# Patient Record
Sex: Male | Born: 1955 | Race: Black or African American | Hispanic: No | Marital: Married | Smoking: Current every day smoker
Health system: Southern US, Community
[De-identification: ages and names within clinical notes are randomized; demographics above are authoritative.]

## PROBLEM LIST (undated history)

## (undated) DIAGNOSIS — R972 Elevated prostate specific antigen [PSA]: Secondary | ICD-10-CM

## (undated) DIAGNOSIS — I10 Essential (primary) hypertension: Secondary | ICD-10-CM

## (undated) DIAGNOSIS — C61 Malignant neoplasm of prostate: Secondary | ICD-10-CM

## (undated) DIAGNOSIS — D759 Disease of blood and blood-forming organs, unspecified: Secondary | ICD-10-CM

## (undated) DIAGNOSIS — E785 Hyperlipidemia, unspecified: Secondary | ICD-10-CM

## (undated) DIAGNOSIS — G4730 Sleep apnea, unspecified: Secondary | ICD-10-CM

## (undated) DIAGNOSIS — E039 Hypothyroidism, unspecified: Secondary | ICD-10-CM

## (undated) DIAGNOSIS — F419 Anxiety disorder, unspecified: Secondary | ICD-10-CM

## (undated) HISTORY — PX: OTHER SURGICAL HISTORY: SHX169

## (undated) HISTORY — DX: Hyperlipidemia, unspecified: E78.5

## (undated) HISTORY — DX: Hypothyroidism, unspecified: E03.9

## (undated) HISTORY — DX: Disease of blood and blood-forming organs, unspecified: D75.9

## (undated) HISTORY — DX: Anxiety disorder, unspecified: F41.9

## (undated) HISTORY — DX: Elevated prostate specific antigen (PSA): R97.20

## (undated) HISTORY — DX: Sleep apnea, unspecified: G47.30

---

## 2007-11-15 DIAGNOSIS — G4733 Obstructive sleep apnea (adult) (pediatric): Secondary | ICD-10-CM | POA: Insufficient documentation

## 2007-11-15 DIAGNOSIS — G4730 Sleep apnea, unspecified: Secondary | ICD-10-CM

## 2007-11-15 HISTORY — DX: Sleep apnea, unspecified: G47.30

## 2009-08-26 ENCOUNTER — Ambulatory Visit: Payer: Self-pay | Admitting: Family Medicine

## 2010-02-11 ENCOUNTER — Ambulatory Visit: Payer: Self-pay | Admitting: Internal Medicine

## 2010-02-12 ENCOUNTER — Ambulatory Visit: Payer: Self-pay | Admitting: Internal Medicine

## 2010-03-14 ENCOUNTER — Ambulatory Visit: Payer: Self-pay | Admitting: Internal Medicine

## 2015-07-17 ENCOUNTER — Ambulatory Visit: Admission: EM | Admit: 2015-07-17 | Discharge: 2015-07-17 | Discharge status: Absent without leave | Payer: Self-pay

## 2015-07-17 NOTE — ED Notes (Signed)
Pt decided to see PCP and have Sleep Study repeated before going forward with DOT Physical, as he has hx of Sleep Apnea but has not slept with CPAP in years. This is an initial DOT and he said he did not know the DOT rules. Very pleasant.

## 2015-11-11 DIAGNOSIS — E039 Hypothyroidism, unspecified: Secondary | ICD-10-CM | POA: Insufficient documentation

## 2015-11-11 DIAGNOSIS — R972 Elevated prostate specific antigen [PSA]: Secondary | ICD-10-CM

## 2015-11-11 DIAGNOSIS — E038 Other specified hypothyroidism: Secondary | ICD-10-CM

## 2015-11-11 HISTORY — DX: Other specified hypothyroidism: E03.8

## 2015-11-11 HISTORY — DX: Elevated prostate specific antigen (PSA): R97.20

## 2016-01-21 ENCOUNTER — Other Ambulatory Visit: Payer: Self-pay

## 2016-02-03 ENCOUNTER — Encounter: Payer: Self-pay | Admitting: Urology

## 2016-02-03 ENCOUNTER — Ambulatory Visit (INDEPENDENT_AMBULATORY_CARE_PROVIDER_SITE_OTHER): Payer: 59 | Admitting: Urology

## 2016-02-03 VITALS — BP 162/91 | HR 102 | Ht 70.0 in | Wt 190.0 lb

## 2016-02-03 DIAGNOSIS — R972 Elevated prostate specific antigen [PSA]: Secondary | ICD-10-CM

## 2016-02-03 DIAGNOSIS — R3129 Other microscopic hematuria: Secondary | ICD-10-CM

## 2016-02-03 DIAGNOSIS — F419 Anxiety disorder, unspecified: Secondary | ICD-10-CM

## 2016-02-03 DIAGNOSIS — D759 Disease of blood and blood-forming organs, unspecified: Secondary | ICD-10-CM

## 2016-02-03 DIAGNOSIS — E785 Hyperlipidemia, unspecified: Secondary | ICD-10-CM

## 2016-02-03 HISTORY — DX: Anxiety disorder, unspecified: F41.9

## 2016-02-03 HISTORY — DX: Hyperlipidemia, unspecified: E78.5

## 2016-02-03 HISTORY — DX: Disease of blood and blood-forming organs, unspecified: D75.9

## 2016-02-03 LAB — URINALYSIS, COMPLETE
Bilirubin, UA: NEGATIVE
Glucose, UA: NEGATIVE
Ketones, UA: NEGATIVE
LEUKOCYTES UA: NEGATIVE
Nitrite, UA: NEGATIVE
PH UA: 5.5 (ref 5.0–7.5)
PROTEIN UA: NEGATIVE
Specific Gravity, UA: 1.025 (ref 1.005–1.030)
Urobilinogen, Ur: 2 mg/dL — ABNORMAL HIGH (ref 0.2–1.0)

## 2016-02-03 LAB — MICROSCOPIC EXAMINATION
BACTERIA UA: NONE SEEN
WBC UA: NONE SEEN /HPF (ref 0–?)

## 2016-02-03 NOTE — Progress Notes (Signed)
02/03/2016 1:26 PM   Chad Cross 1956-07-15 [id number]  Referring provider: Victoriano Lain Gauger  Chief Complaint  Patient presents with  . Hematuria    New Patient  . Elevated PSA    HPI: 60 year old African-American male referred by his PCP, Gaetano Net, for further evaluation of elevated PSA/possible microscopic hematuria.    Microscopic hematuria Patient underwent routine UA at the time of drug screen for work. This was on 01/14/2016.  Dip at the time showed 2+ blood, no microscopic exam performed to confirm presence of microscopic hematuria. Patient denies a history of microscopic hematuria which she is aware. No history of gross hematuria. No voiding symptoms. Denies, frequency, urgency, kidney stones, or urinary tract infections.  UA today again shows 2+ blood but no evidence of microscopic hematuria.  Elevated PSA   Checked by PCP x 1 in December in 2016 4.95.  No previous PSA for reviewed.  No recent rectal exam. He does have a bother with prostate cancer dx at age 40, underwent prostatectecomy.  No weight loss or bone pain.   PMH: Past Medical History  Diagnosis Date  . Subclinical hypothyroidism 11/11/2015  . Anxiety 02/03/2016  . Apnea, sleep 11/15/2007    Overview:  Has CPAP but is unable to tolerate the mask due to anxiety.   . Cytopenia 02/03/2016  . Elevated prostate specific antigen (PSA) 11/11/2015  . HLD (hyperlipidemia) 02/03/2016    Surgical History: Past Surgical History  Procedure Laterality Date  . None      Home Medications:    Medication List       This list is accurate as of: 02/03/16  1:26 PM.  Always use your most recent med list.               levothyroxine 25 MCG tablet  Commonly known as:  SYNTHROID, LEVOTHROID  Take 25 mcg by mouth daily.     simvastatin 10 MG tablet  Commonly known as:  ZOCOR  TAKE 1 TABLET (10 MG TOTAL) BY MOUTH NIGHTLY.        Allergies: No Known Allergies  Family History: Family History    Problem Relation Age of Onset  . Prostate cancer Brother   . Bladder Cancer Neg Hx   . Kidney cancer Neg Hx     Social History:  reports that he has been smoking Cigarettes.  He has been smoking about 1.00 pack per day. He does not have any smokeless tobacco history on file. He reports that he drinks alcohol. He reports that he does not use illicit drugs.  ROS: UROLOGY Frequent Urination?: No Hard to postpone urination?: No Burning/pain with urination?: No Get up at night to urinate?: No Leakage of urine?: No Urine stream starts and stops?: No Trouble starting stream?: No Do you have to strain to urinate?: No Blood in urine?: Yes Urinary tract infection?: No Sexually transmitted disease?: No Injury to kidneys or bladder?: No Painful intercourse?: No Weak stream?: No Erection problems?: No Penile pain?: No  Gastrointestinal Nausea?: No Vomiting?: No Indigestion/heartburn?: No Diarrhea?: No Constipation?: No  Constitutional Fever: No Night sweats?: No Weight loss?: No Fatigue?: No  Skin Skin rash/lesions?: No Itching?: No  Eyes Blurred vision?: No Double vision?: No  Ears/Nose/Throat Sore throat?: No Sinus problems?: No  Hematologic/Lymphatic Swollen glands?: No Easy bruising?: No  Cardiovascular Leg swelling?: No Chest pain?: No  Respiratory Cough?: No Shortness of breath?: No  Endocrine Excessive thirst?: No  Musculoskeletal Back pain?: No Joint pain?: No  Neurological  Headaches?: No Dizziness?: No  Psychologic Depression?: No Anxiety?: No  Physical Exam: BP 162/91 mmHg  Pulse 102  Ht 5\' 10"  (1.778 m)  Wt 190 lb (86.183 kg)  BMI 27.26 kg/m2  Constitutional:  Alert and oriented, No acute distress. HEENT: [place] AT, moist mucus membranes.  Trachea midline, no masses. Cardiovascular: No clubbing, cyanosis, or edema. Respiratory: Normal respiratory effort, no increased work of breathing. GI: Abdomen is soft, nontender, nondistended,  no abdominal masses GU: No CVA tenderness.  Rectal: Normal strength or tone. 40 cc prostate, smooth, nontender, no nodules. Skin: No rashes, bruises or suspicious lesions. Lymph: No cervical or inguinal adenopathy. Neurologic: Grossly intact, no focal deficits, moving all 4 extremities. Psychiatric: Normal mood and affect.  Laboratory Data:   Urinalysis Results for orders placed or performed in visit on 02/03/16  Microscopic Examination  Result Value Ref Range   WBC, UA None seen 0 -  5 /hpf   RBC, UA 0-2 0 -  2 /hpf   Epithelial Cells (non renal) 0-10 0 - 10 /hpf   Mucus, UA Present (A) Not Estab.   Bacteria, UA None seen None seen/Few  Urinalysis, Complete  Result Value Ref Range   Specific Gravity, UA 1.025 1.005 - 1.030   pH, UA 5.5 5.0 - 7.5   Color, UA Yellow Yellow   Appearance Ur Clear Clear   Leukocytes, UA Negative Negative   Protein, UA Negative Negative/Trace   Glucose, UA Negative Negative   Ketones, UA Negative Negative   RBC, UA 2+ (A) Negative   Bilirubin, UA Negative Negative   Urobilinogen, Ur 2.0 (H) 0.2 - 1.0 mg/dL   Nitrite, UA Negative Negative   Microscopic Examination See below:     Pertinent Imaging: NA  Assessment & Plan:    1. Elevated PSA  We reviewed the implications of an elevated PSA and the uncertainty surrounding it. In general, a man's PSA increases with age and is produced by both normal and cancerous prostate tissue. Differential for elevated PSA is BPH, prostate cancer, infection, recent intercourse/ejaculation, prostate infarction, recent urethroscopic manipulation (foley placement/cystoscopy) and prostatitis. Management of an elevated PSA can include observation or prostate biopsy and wediscussed this in detail. We discussed that indications for prostate biopsy are defined by age and race specific PSA cutoffs as well as a PSA velocity of 0.75/year.  Repeat PSA drawn today, will call with recommendations.  Rectal exam benign.  We  did go ahead and discuss prostate biopsy in detail including the procedure itself, the risks of blood in the urine, stool, and ejaculate, serious infection, and discomfort.    - PSA  2. Microscopic hematuria History of 2+ blood on urine dip but no evidence of microscopic hematuria today. As such, he does not currently meet guidelines for microscopic hematuria workup which includes greater than 3 red blood cells per high-powered field on 2 individual urinalyses in the absence of infection. Recommend repeat UA with microscopic exam either here in our office or by PCP and will reassess need for further workup if positive x2.    - Urinalysis, Complete   Return for will call with PSA results, recommendations.  Hollice Espy, MD  [facility] [address], [place], [place] [address] [phone]

## 2016-02-04 LAB — PSA: PROSTATE SPECIFIC AG, SERUM: 4.2 ng/mL — AB (ref 0.0–4.0)

## 2016-02-05 ENCOUNTER — Telehealth: Payer: Self-pay

## 2016-02-05 NOTE — Telephone Encounter (Signed)
Made PCP aware.  Recommend f/u PSA in 6 months a minimum.    Hollice Espy, MD

## 2016-02-05 NOTE — Telephone Encounter (Signed)
Spoke with patient and notified him of results. Patient states he is not interested in proceeding with a prostate biopsy at this time. It was explained to the patient that this was to rule out prostate cancer and it is important for him to understand the risk involved in declining this test to rule out prostate cancer. Patient verbalized understanding and again stated that he did not want to go ahead with the procedure at this time. He was asked if he would be ok with scheduling a follow up apt to come and discuss this with a physician or at least to schedule a follow up with a PSA to monitor. He states that he would call back to schedule an apt to monitor at another time.

## 2016-02-05 NOTE — Telephone Encounter (Signed)
-----   Message from Hollice Espy, MD sent at 02/04/2016  2:45 PM EDT ----- PSA still elevated at 4.2.  I recommend prostate biopsy given risk factors.  Please arrange and review pre procedure instructions (discussed in the office already).   Hollice Espy, MD

## 2016-02-05 NOTE — Telephone Encounter (Signed)
Left pt mess to call/SW 

## 2019-06-12 ENCOUNTER — Other Ambulatory Visit: Payer: Self-pay

## 2019-06-12 DIAGNOSIS — R972 Elevated prostate specific antigen [PSA]: Secondary | ICD-10-CM

## 2019-06-13 ENCOUNTER — Other Ambulatory Visit: Payer: Self-pay

## 2019-06-13 ENCOUNTER — Other Ambulatory Visit: Payer: Medicare Other

## 2019-06-13 DIAGNOSIS — R972 Elevated prostate specific antigen [PSA]: Secondary | ICD-10-CM

## 2019-06-14 LAB — PSA: Prostate Specific Ag, Serum: 8.8 ng/mL — ABNORMAL HIGH (ref 0.0–4.0)

## 2019-06-18 ENCOUNTER — Ambulatory Visit: Payer: Medicare Other | Admitting: Urology

## 2019-06-18 ENCOUNTER — Encounter: Payer: Self-pay | Admitting: Urology

## 2019-06-18 ENCOUNTER — Other Ambulatory Visit: Payer: Self-pay

## 2019-06-18 VITALS — BP 170/97 | HR 116 | Ht 70.0 in | Wt 207.0 lb

## 2019-06-18 DIAGNOSIS — R972 Elevated prostate specific antigen [PSA]: Secondary | ICD-10-CM

## 2019-06-18 DIAGNOSIS — R3129 Other microscopic hematuria: Secondary | ICD-10-CM

## 2019-06-18 NOTE — Patient Instructions (Signed)

## 2019-06-18 NOTE — Progress Notes (Signed)
06/18/2019 8:41 AM   Chad Cross [birth year] [id number]  Referring provider: [name], NP [address] [place],  [place] [address]  Chief Complaint  Patient presents with  . Elevated PSA    HPI: 63 year old male who presents today for further evaluation of rising PSA and microscopic hematuria.  He was seen and evaluated in 01/2016 for possible microscopic hematuria.  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  Notably, the time he also had an elevated PSA to 4.95 in December 2016.  His brother was diagnosed with prostate cancer at age 54 and underwent prostatectomy.  Given his risk factors and absolute PSA, he was advised to undergo prostate biopsy but declined.  He was advised to follow-up in 6 months but failed to do so.  In the interim, his PSA continues to climb.  His PSA on 05/2019 rose to 8.8.  Repeat today is pending.  He denies any significant urinary symptoms.  He gets up once at night to void.  He feels like he empties his bladder sufficiently.  He denies any gross hematuria or dysuria.  He is a smoker.  No weight loss or bone pain.  PMH: Past Medical History:  Diagnosis Date  . Anxiety 02/03/2016  . Apnea, sleep 11/15/2007   Overview:  Has CPAP but is unable to tolerate the mask due to anxiety.   . Cytopenia 02/03/2016  . Elevated prostate specific antigen (PSA) 11/11/2015  . HLD (hyperlipidemia) 02/03/2016  . Subclinical hypothyroidism 11/11/2015    Surgical History: Past Surgical History:  Procedure Laterality Date  . none      Home Medications:  Allergies as of 06/18/2019      Reactions   Lisinopril Cough      Medication List       Accurate as of June 18, 2019 11:59 PM. If you have any questions, ask your nurse or doctor.        levothyroxine 25 MCG tablet Commonly known as: SYNTHROID Take 25 mcg by mouth daily.   simvastatin 10 MG tablet  Commonly known as: ZOCOR TAKE 1 TABLET (10 MG TOTAL) BY MOUTH NIGHTLY.       Allergies:  Allergies  Allergen Reactions  . Lisinopril Cough    Family History: Family History  Problem Relation Age of Onset  . Prostate cancer Brother   . Bladder Cancer Neg Hx   . Kidney cancer Neg Hx     Social History:  reports that he has been smoking cigarettes. He has been smoking about 1.00 pack per day. He has never used smokeless tobacco. He reports current alcohol use. He reports that he does not use drugs.  ROS: UROLOGY Frequent Urination?: No Hard to postpone urination?: No Burning/pain with urination?: No Get up at night to urinate?: No Leakage of urine?: No Urine stream starts and stops?: No Trouble starting stream?: No Do you have to strain to urinate?: No Blood in urine?: No Urinary tract infection?: No Sexually transmitted disease?: No Injury to kidneys or bladder?: No Painful intercourse?: No Weak stream?: No Erection problems?: No Penile pain?: No  Gastrointestinal Nausea?: No Vomiting?: No Indigestion/heartburn?: No Diarrhea?: No Constipation?: No  Constitutional Fever: No Night sweats?: No Weight loss?: No Fatigue?: No  Skin Skin rash/lesions?: No Itching?: No  Eyes Blurred vision?: No Double vision?: No  Ears/Nose/Throat Sore throat?: No Sinus problems?: No  Hematologic/Lymphatic Swollen glands?: No Easy bruising?: No  Cardiovascular Leg swelling?: No Chest pain?: No  Respiratory Cough?: No Shortness of breath?: No  Endocrine Excessive thirst?: No  Musculoskeletal Back pain?: No Joint pain?: No  Neurological Headaches?: No Dizziness?: No  Psychologic Depression?: No Anxiety?: No  Physical Exam: BP (!) 170/97   Pulse (!) 116   Ht 5\' 10"  (1.778 m)   Wt 207 lb (93.9 kg)   BMI 29.70 kg/m   Constitutional:  Alert and oriented, No acute distress. HEENT: [place] AT, moist mucus membranes.  Trachea midline, no masses.  Cardiovascular: No clubbing, cyanosis, or edema. Respiratory: Normal respiratory effort, no increased work of breathing. GI: Abdomen is soft, nontender, nondistended, no abdominal masses Rectal: Normal sphincter tone.  Mildly enlarged prostate with possible nodule at apex, unable to palpate base of gland due to habitus. Skin: No rashes, bruises or suspicious lesions. Neurologic: Grossly intact, no focal deficits, moving all 4 extremities. Psychiatric: Normal mood and affect.  Laboratory Data: PSA trend as above  Urinalysis Results for orders placed or performed in visit on 06/18/19  Microscopic Examination   URINE  Result Value Ref Range   WBC, UA 0-5 0 - 5 /hpf   RBC 3-10 (A) 0 - 2 /hpf   Epithelial Cells (non renal) 0-10 0 - 10 /hpf   Bacteria, UA None seen None seen/Few  PSA  Result Value Ref Range   Prostate Specific Ag, Serum 10.5 (H) 0.0 - 4.0 ng/mL  Urinalysis, Complete  Result Value Ref Range   Specific Gravity, UA 1.015 1.005 - 1.030   pH, UA 6.5 5.0 - 7.5   Color, UA Straw Yellow   Appearance Ur Cloudy (A) Clear   Leukocytes,UA Negative Negative   Protein,UA 2+ (A) Negative/Trace   Glucose, UA Negative Negative   Ketones, UA Negative Negative   RBC, UA 1+ (A) Negative   Bilirubin, UA Negative Negative   Urobilinogen, Ur 0.2 0.2 - 1.0 mg/dL   Nitrite, UA Negative Negative   Microscopic Examination See below:     Assessment & Plan:    1. Elevated prostate specific antigen (PSA) Multiple risk factors including elevated and rising PSA as well as family history of prostate cancer  As per previously, I have strongly recommended that he pursue prostate biopsy.  We discussed this in the past and that this at this point, he is finally interested in pursuing this in light of the significant rise over time.  Repeat PSA today is pending.  We discussed prostate biopsy in detail including the procedure itself, the risks of blood in the urine, stool, and ejaculate, serious  infection, and discomfort. He is willing to proceed with this as discussed. - PSA  2. Elevated PSA As above - Urinalysis  3. Microscopic hematuria We discussed the differential diagnosis for microscopic hematuria including nephrolithiasis, renal or upper tract tumors, bladder stones, UTIs, or bladder tumors as well as undetermined etiologies. Per AUA guidelines, I did recommend complete microscopic hematuria evaluation including CTU, possible urine cytology, and office cystoscopy.   - CT HEMATURIA WORKUP; Future  Return for schedule prostate biopsy. / cystoscopy/ f/u CT urogram  Hollice Espy, MD  [facility] [address], [place], [place] [address] [phone]

## 2019-06-19 LAB — URINALYSIS, COMPLETE
Bilirubin, UA: NEGATIVE
Glucose, UA: NEGATIVE
Ketones, UA: NEGATIVE
Leukocytes,UA: NEGATIVE
Nitrite, UA: NEGATIVE
Specific Gravity, UA: 1.015 (ref 1.005–1.030)
Urobilinogen, Ur: 0.2 mg/dL (ref 0.2–1.0)
pH, UA: 6.5 (ref 5.0–7.5)

## 2019-06-19 LAB — MICROSCOPIC EXAMINATION: Bacteria, UA: NONE SEEN

## 2019-06-19 LAB — PSA: Prostate Specific Ag, Serum: 10.5 ng/mL — ABNORMAL HIGH (ref 0.0–4.0)

## 2019-06-26 ENCOUNTER — Other Ambulatory Visit: Payer: Self-pay

## 2019-06-26 ENCOUNTER — Other Ambulatory Visit: Payer: Self-pay | Admitting: Urology

## 2019-06-26 DIAGNOSIS — R972 Elevated prostate specific antigen [PSA]: Secondary | ICD-10-CM

## 2019-06-27 ENCOUNTER — Ambulatory Visit
Admission: RE | Admit: 2019-06-27 | Discharge: 2019-06-27 | Disposition: A | Discharge status: Home / Self Care | Payer: Medicare Other | Source: Ambulatory Visit | Attending: Urology | Admitting: Urology

## 2019-06-27 ENCOUNTER — Other Ambulatory Visit: Payer: Self-pay

## 2019-06-27 ENCOUNTER — Other Ambulatory Visit
Admission: RE | Admit: 2019-06-27 | Discharge: 2019-06-27 | Disposition: A | Discharge status: Home / Self Care | Payer: Medicare Other | Source: Ambulatory Visit | Attending: Urology | Admitting: Urology

## 2019-06-27 DIAGNOSIS — R972 Elevated prostate specific antigen [PSA]: Secondary | ICD-10-CM

## 2019-06-27 DIAGNOSIS — R3129 Other microscopic hematuria: Secondary | ICD-10-CM | POA: Diagnosis not present

## 2019-06-27 DIAGNOSIS — I700 Atherosclerosis of aorta: Secondary | ICD-10-CM | POA: Insufficient documentation

## 2019-06-27 HISTORY — DX: Essential (primary) hypertension: I10

## 2019-06-27 LAB — CREATININE, SERUM
Creatinine, Ser: 1.15 mg/dL (ref 0.61–1.24)
GFR calc Af Amer: >60 mL/min (ref >60)
GFR calc non Af Amer: >60 mL/min (ref >60)

## 2019-06-27 MED ORDER — IOHEXOL 300 MG/ML  SOLN
150.0000 mL | Freq: Once | INTRAMUSCULAR | Status: AC | PRN
  Administered 2019-06-27: 10:00:00 125 mL via INTRAVENOUS

## 2019-07-23 ENCOUNTER — Other Ambulatory Visit: Payer: Self-pay | Admitting: Urology

## 2019-07-23 ENCOUNTER — Encounter: Payer: Self-pay | Admitting: Urology

## 2019-07-23 ENCOUNTER — Ambulatory Visit (INDEPENDENT_AMBULATORY_CARE_PROVIDER_SITE_OTHER): Payer: Medicare Other | Admitting: Urology

## 2019-07-23 ENCOUNTER — Other Ambulatory Visit: Payer: Self-pay

## 2019-07-23 VITALS — BP 185/108 | HR 118 | Ht 70.0 in | Wt 206.0 lb

## 2019-07-23 DIAGNOSIS — R972 Elevated prostate specific antigen [PSA]: Secondary | ICD-10-CM | POA: Diagnosis not present

## 2019-07-23 DIAGNOSIS — R3129 Other microscopic hematuria: Secondary | ICD-10-CM

## 2019-07-23 DIAGNOSIS — K769 Liver disease, unspecified: Secondary | ICD-10-CM

## 2019-07-23 MED ORDER — LEVOFLOXACIN 500 MG PO TABS
500.0000 mg | ORAL_TABLET | Freq: Once | ORAL | Status: AC
  Administered 2019-07-23: 500 mg via ORAL

## 2019-07-23 MED ORDER — GENTAMICIN SULFATE 40 MG/ML IJ SOLN
80.0000 mg | Freq: Once | INTRAMUSCULAR | Status: AC
  Administered 2019-07-23: 80 mg via INTRAMUSCULAR

## 2019-07-23 NOTE — Progress Notes (Signed)
   07/23/19  CC:  Chief Complaint  Patient presents with  . Prostate Biopsy  . Cysto    HPI:  63 year old male with elevated rising PSA presents today for prostate biopsy.   Blood pressure (!) 185/108, pulse (!) 118, height 5\' 10"  (1.778 m), weight 206 lb (93.4 kg). NED. A&Ox3.   No respiratory distress   Abd soft, NT, ND Normal sphincter tone  Prostate Biopsy Procedure   Informed consent was obtained after discussing risks/benefits of the procedure.  A time out was performed to ensure correct patient identity.  Pre-Procedure: - Gentamicin given prophylactically - Levaquin 500 mg administered PO -Transrectal Ultrasound performed revealing a 46.6 gm prostate -No significant hypoechoic or median lobe noted  Procedure: - Prostate block performed using 10 cc 1% lidocaine and biopsies taken from sextant areas, a total of 12 under ultrasound guidance.  Post-Procedure: - Patient tolerated the procedure well - He was counseled to seek immediate medical attention if experiences any severe pain, significant bleeding, or fevers - Return in two week to discuss biopsy results   Hollice Espy, MD

## 2019-07-23 NOTE — Progress Notes (Signed)
   07/23/19  CC:  Chief Complaint  Patient presents with  . Prostate Biopsy  . Cysto    HPI: 63 year old male with a personal history of elevated PSA as well as microscopic hematuria who presents today both for cystoscopy and prostate biopsy.  Notably, in the interim, he underwent CT urogram which shows no GU pathology.   He definitely does have 2.3 cm liver mass, most likely related to a hemangioma however was not completely characterized.   Blood pressure (!) 185/108, pulse (!) 118, height 5\' 10"  (1.778 m), weight 206 lb (93.4 kg). NED. A&Ox3.   No respiratory distress   Abd soft, NT, ND Normal phallus with bilateral descended testicles  Cystoscopy Procedure Note  Patient identification was confirmed, informed consent was obtained, and patient was prepped using Betadine solution.  Lidocaine jelly was administered per urethral meatus.     Pre-Procedure: - Inspection reveals a normal caliber ureteral meatus.  Procedure: The flexible cystoscope was introduced without difficulty - No urethral strictures/lesions are present. - Enlarged prostate with bilobar coaptation - Normal bladder neck - Bilateral ureteral orifices identified - Bladder mucosa  reveals no ulcers, tumors, or lesions - No bladder stones - No trabeculation  Retroflexion unremarkable   Post-Procedure: - Patient tolerated the procedure well  Assessment/ Plan:  1. Elevated PSA S/p prostate biopsy today - gentamicin (GARAMYCIN) injection 80 mg - levofloxacin (LEVAQUIN) tablet 500 mg  2. Microscopic hematuria CT urogram and cystoscopy negative  3. Liver lesion Incidental on CT scan Likely hemangioma Radiology recommends MRI of liver to rule out underlying malignancy, patient is agreeable this plan - MR LIVER W WO CONTRAST; Future  Hollice Espy, MD

## 2019-07-23 NOTE — Progress Notes (Deleted)
07/23/2019 10:20 AM   Chad Cross [birth year] [id number]  Referring provider: [name], NP [address] [place],  [place] [address]  No chief complaint on file.   HPI:    PMH: Past Medical History:  Diagnosis Date  . Anxiety 02/03/2016  . Apnea, sleep 11/15/2007   Overview:  Has CPAP but is unable to tolerate the mask due to anxiety.   . Cytopenia 02/03/2016  . Elevated prostate specific antigen (PSA) 11/11/2015  . HLD (hyperlipidemia) 02/03/2016  . Hypertension   . Subclinical hypothyroidism 11/11/2015    Surgical History: Past Surgical History:  Procedure Laterality Date  . none      Home Medications:  Allergies as of 07/23/2019      Reactions   Lisinopril Cough      Medication List       Accurate as of July 23, 2019 10:20 AM. If you have any questions, ask your nurse or doctor.        levothyroxine 25 MCG tablet Commonly known as: SYNTHROID Take 25 mcg by mouth daily.   simvastatin 10 MG tablet Commonly known as: ZOCOR TAKE 1 TABLET (10 MG TOTAL) BY MOUTH NIGHTLY.       Allergies:  Allergies  Allergen Reactions  . Lisinopril Cough    Family History: Family History  Problem Relation Age of Onset  . Prostate cancer Brother   . Bladder Cancer Neg Hx   . Kidney cancer Neg Hx     Social History:  reports that he has been smoking cigarettes. He has been smoking about 1.00 pack per day. He has never used smokeless tobacco. He reports current alcohol use. He reports that he does not use drugs.  ROS:                                        Physical Exam: There were no vitals taken for this visit.  Constitutional:  Alert and oriented, No acute distress. HEENT: [place] AT, moist mucus membranes.  Trachea midline, no masses. Cardiovascular: No clubbing, cyanosis, or edema. Respiratory: Normal respiratory effort, no increased work of breathing. GI: Abdomen is soft, nontender, nondistended, no abdominal masses GU:  No CVA tenderness Lymph: No cervical or inguinal lymphadenopathy. Skin: No rashes, bruises or suspicious lesions. Neurologic: Grossly intact, no focal deficits, moving all 4 extremities. Psychiatric: Normal mood and affect.  Laboratory Data: No results found for: WBC, HGB, HCT, MCV, PLT  Lab Results  Component Value Date   CREATININE 1.15 06/27/2019    No results found for: PSA  No results found for: TESTOSTERONE  No results found for: HGBA1C  Urinalysis    Component Value Date/Time   APPEARANCEUR Cloudy (A) 06/18/2019 1007   GLUCOSEU Negative 06/18/2019 1007   BILIRUBINUR Negative 06/18/2019 1007   PROTEINUR 2+ (A) 06/18/2019 1007   NITRITE Negative 06/18/2019 1007   LEUKOCYTESUR Negative 06/18/2019 1007    Lab Results  Component Value Date   LABMICR See below: 06/18/2019   WBCUA 0-5 06/18/2019   RBCUA 0-2 02/03/2016   LABEPIT 0-10 06/18/2019   MUCUS Present (A) 02/03/2016   BACTERIA None seen 06/18/2019    Pertinent Imaging: *** No results found for this or any previous visit. No results found for this or any previous visit. No results found for this or any previous visit. No results found for this or any previous visit. No results found for  this or any previous visit. No results found for this or any previous visit. Results for orders placed during the hospital encounter of 06/27/19  CT HEMATURIA WORKUP   Narrative CLINICAL DATA:  Microscopic hematuria.  EXAM: CT ABDOMEN AND PELVIS WITHOUT AND WITH CONTRAST  TECHNIQUE: Multidetector CT imaging of the abdomen and pelvis was performed following the standard protocol before and following the bolus administration of intravenous contrast.  CONTRAST:  155mL OMNIPAQUE IOHEXOL 300 MG/ML  SOLN  COMPARISON:  None.  FINDINGS: Lower Chest: No acute findings.  Hepatobiliary: Several liver lesions are seen, with largest in segment 4A measuring 2.3 cm on image 16/4. The larger lesions show evidence of  peripheral enhancement, suggesting the could represent hemangiomas, however CT characteristics remain indeterminate. Gallbladder is unremarkable. No evidence of biliary ductal dilatation.  Pancreas:  No mass or inflammatory changes.  Spleen: Within normal limits in size and appearance.  Adrenals/Urinary Tract: No adrenal masses identified. No evidence of urolithiasis or hydronephrosis. Mild scarring seen involving the lower pole of left kidney. No renal masses identified. No masses seen involving the ureters or bladder. Diffuse bladder wall thickening is seen, most likely due to chronic bladder outlet obstruction given enlarged prostate.  Stomach/Bowel: No evidence of obstruction, inflammatory process or abnormal fluid collections.  Vascular/Lymphatic: No pathologically enlarged lymph nodes. No abdominal aortic aneurysm. Aortic atherosclerosis.  Reproductive:  Mildly enlarged prostate.  Other:  None.  Musculoskeletal:  No suspicious bone lesions identified.  IMPRESSION: 1. No radiographic evidence of urinary tract neoplasm, urolithiasis, or hydronephrosis. 2. Mildly enlarged prostate, and findings of chronic bladder outlet obstruction. 3. Several indeterminate liver lesions, largest measuring 2.3 cm. Abdomen MRI without and with contrast is recommended for further characterization.   Electronically Signed   By: Marlaine Hind M.D.   On: 06/27/2019 11:45    No results found for this or any previous visit.  Assessment & Plan:    There are no diagnoses linked to this encounter.  No follow-ups on file.  Hollice Espy, MD  [facility] [address] [place], [place] [address] [phone]

## 2019-07-26 LAB — ANATOMIC PATHOLOGY REPORT: PDF Image: 0

## 2019-08-05 ENCOUNTER — Other Ambulatory Visit: Payer: Self-pay | Admitting: Urology

## 2019-08-06 ENCOUNTER — Encounter: Payer: Self-pay | Admitting: Urology

## 2019-08-06 ENCOUNTER — Ambulatory Visit (INDEPENDENT_AMBULATORY_CARE_PROVIDER_SITE_OTHER): Payer: Medicare Other | Admitting: Urology

## 2019-08-06 ENCOUNTER — Other Ambulatory Visit: Payer: Self-pay

## 2019-08-06 VITALS — BP 159/101 | HR 128 | Ht 70.0 in | Wt 205.0 lb

## 2019-08-06 DIAGNOSIS — C61 Malignant neoplasm of prostate: Secondary | ICD-10-CM

## 2019-08-06 DIAGNOSIS — K769 Liver disease, unspecified: Secondary | ICD-10-CM

## 2019-08-06 NOTE — Progress Notes (Signed)
08/06/2019 12:38 PM   Chad Cross 1956-06-16 [id number]  Referring provider: [name], NP [address] [place],  [place] [address]  Chief Complaint  Patient presents with  . Results    biopsy results    HPI: 63 year old male who presents today to discuss newly diagnosed prostate cancer.  Notably, he was seen and evaluated in 2017 for both microscopic hematuria as well as elevated PSA and counseled to undergo evaluation for both including prostate biopsy.  He declined and was lost to follow-up.  Most recently, he underwent CT urogram which showed an incidental liver lesion for which he is scheduled for a liver MRI.  No appreciable lymphadenopathy noted.  This was otherwise unremarkable.  Cystoscopy was also unremarkable.  He returned more recently with continued rising PSA.  Most recent PSA around the time of biopsy was 8.8 on 05/2019.  He does have family history of prostate cancer, his brother underwent prostatectomy.  He underwent prostate biopsy earlier this month.  TRUS volume 46.6 g.  Rectal exam somewhat limited by habitus but possible nodule apex.   Unfortunately, this showed evidence of high risk prostate cancer on the left.  This involved a total of 5 of 12 cores.  He had most of his disease at the left base involving 100% of Gleason 3+4 tissue as well as 93% of Gleason 5+4 at the left lateral and left base.  He had additional cortically sent 3+4, up for present the tissue at the left lateral mid as well as a single core of 3+3 at the left apex.  No previous abdominal surgeries.  IPSS    Row Name 08/06/19 1100         International Prostate Symptom Score   How often have you had the sensation of not emptying your bladder?  More than half the time     How often have you had to urinate less than every two hours?  Less than half the time     How often have you found you stopped and started again several times when you urinated?  Not at All     How often  have you found it difficult to postpone urination?  Not at All     How often have you had a weak urinary stream?  Not at All     How often have you had to strain to start urination?  Not at All     How many times did you typically get up at night to urinate?  1 Time     Total IPSS Score  7       Quality of Life due to urinary symptoms   If you were to spend the rest of your life with your urinary condition just the way it is now how would you feel about that?  Mostly Satisfied        Score:  1-7 Mild 8-19 Moderate 20-35 Severe  SHIM    Row Name 08/06/19 1138         SHIM: Over the last 6 months:   How do you rate your confidence that you could get and keep an erection?  High     When you had erections with sexual stimulation, how often were your erections hard enough for penetration (entering your partner)?  Almost Never or Never     During sexual intercourse, how often were you able to maintain your erection after you had penetrated (entered) your partner?  Almost Always or Always  During sexual intercourse, how difficult was it to maintain your erection to completion of intercourse?  Not Difficult     When you attempted sexual intercourse, how often was it satisfactory for you?  Almost Always or Always       SHIM Total Score   SHIM  20        PMH: Past Medical History:  Diagnosis Date  . Anxiety 02/03/2016  . Apnea, sleep 11/15/2007   Overview:  Has CPAP but is unable to tolerate the mask due to anxiety.   . Cytopenia 02/03/2016  . Elevated prostate specific antigen (PSA) 11/11/2015  . HLD (hyperlipidemia) 02/03/2016  . Hypertension   . Subclinical hypothyroidism 11/11/2015    Surgical History: Past Surgical History:  Procedure Laterality Date  . none      Home Medications:  Allergies as of 08/06/2019      Reactions   Lisinopril Cough      Medication List       Accurate as of August 06, 2019 12:38 PM. If you have any questions, ask your nurse or doctor.         levothyroxine 25 MCG tablet Commonly known as: SYNTHROID Take 25 mcg by mouth daily.   simvastatin 10 MG tablet Commonly known as: ZOCOR TAKE 1 TABLET (10 MG TOTAL) BY MOUTH NIGHTLY.       Allergies:  Allergies  Allergen Reactions  . Lisinopril Cough    Family History: Family History  Problem Relation Age of Onset  . Prostate cancer Brother   . Bladder Cancer Neg Hx   . Kidney cancer Neg Hx     Social History:  reports that he has been smoking cigarettes. He has been smoking about 1.00 pack per day. He has never used smokeless tobacco. He reports current alcohol use. He reports that he does not use drugs.  ROS: UROLOGY Frequent Urination?: No Hard to postpone urination?: No Burning/pain with urination?: No Get up at night to urinate?: No Leakage of urine?: No Urine stream starts and stops?: No Trouble starting stream?: No Do you have to strain to urinate?: No Blood in urine?: No Urinary tract infection?: No Sexually transmitted disease?: No Injury to kidneys or bladder?: No Painful intercourse?: No Weak stream?: No Erection problems?: No Penile pain?: No  Gastrointestinal Nausea?: No Vomiting?: No Indigestion/heartburn?: No Diarrhea?: No Constipation?: No  Constitutional Fever: No Night sweats?: No Weight loss?: No Fatigue?: No  Skin Skin rash/lesions?: No Itching?: No  Eyes Blurred vision?: No Double vision?: No  Ears/Nose/Throat Sore throat?: No Sinus problems?: No  Hematologic/Lymphatic Swollen glands?: No Easy bruising?: No  Cardiovascular Leg swelling?: No Chest pain?: No  Respiratory Cough?: No Shortness of breath?: No  Endocrine Excessive thirst?: No  Musculoskeletal Back pain?: No Joint pain?: No  Neurological Headaches?: No Dizziness?: No  Psychologic Depression?: No Anxiety?: No  Physical Exam: BP (!) 159/101   Pulse (!) 128   Ht 5\' 10"  (1.778 m)   Wt 205 lb (93 kg)   BMI 29.41 kg/m    Constitutional:  Alert and oriented, No acute distress. HEENT: [place] AT, moist mucus membranes.  Trachea midline, no masses. Cardiovascular: No clubbing, cyanosis, or edema. Respiratory: Normal respiratory effort, no increased work of breathing. Skin: No rashes, bruises or suspicious lesions. Neurologic: Grossly intact, no focal deficits, moving all 4 extremities. Psychiatric: Normal mood and affect.  Laboratory Data: Lab Results  Component Value Date   CREATININE 1.15 06/27/2019   PSA as above  Pertinent Imaging: Results  for orders placed during the hospital encounter of 06/27/19  CT HEMATURIA WORKUP   Narrative CLINICAL DATA:  Microscopic hematuria.  EXAM: CT ABDOMEN AND PELVIS WITHOUT AND WITH CONTRAST  TECHNIQUE: Multidetector CT imaging of the abdomen and pelvis was performed following the standard protocol before and following the bolus administration of intravenous contrast.  CONTRAST:  154mL OMNIPAQUE IOHEXOL 300 MG/ML  SOLN  COMPARISON:  None.  FINDINGS: Lower Chest: No acute findings.  Hepatobiliary: Several liver lesions are seen, with largest in segment 4A measuring 2.3 cm on image 16/4. The larger lesions show evidence of peripheral enhancement, suggesting the could represent hemangiomas, however CT characteristics remain indeterminate. Gallbladder is unremarkable. No evidence of biliary ductal dilatation.  Pancreas:  No mass or inflammatory changes.  Spleen: Within normal limits in size and appearance.  Adrenals/Urinary Tract: No adrenal masses identified. No evidence of urolithiasis or hydronephrosis. Mild scarring seen involving the lower pole of left kidney. No renal masses identified. No masses seen involving the ureters or bladder. Diffuse bladder wall thickening is seen, most likely due to chronic bladder outlet obstruction given enlarged prostate.  Stomach/Bowel: No evidence of obstruction, inflammatory process or abnormal fluid collections.   Vascular/Lymphatic: No pathologically enlarged lymph nodes. No abdominal aortic aneurysm. Aortic atherosclerosis.  Reproductive:  Mildly enlarged prostate.  Other:  None.  Musculoskeletal:  No suspicious bone lesions identified.  IMPRESSION: 1. No radiographic evidence of urinary tract neoplasm, urolithiasis, or hydronephrosis. 2. Mildly enlarged prostate, and findings of chronic bladder outlet obstruction. 3. Several indeterminate liver lesions, largest measuring 2.3 cm. Abdomen MRI without and with contrast is recommended for further characterization.   Electronically Signed   By: Marlaine Hind M.D.   On: 06/27/2019 11:45    CT scan was reviewed again today.  No obvious evidence of metastatic disease including no lymphadenopathy.  Seminal vesicles appear to be grossly symmetric which is reassuring.  Assessment & Plan:    1. Prostate cancer [facility]) 63 year old male with newly diagnosed high risk prostate cancer.  The patient was counseled about the natural history of prostate cancer and the standard treatment options that are available for prostate cancer. It was explained to him how his age and life expectancy, clinical stage, Gleason score, and PSA affect his prognosis, the decision to proceed with additional staging studies, as well as how that information influences recommended treatment strategies. We discussed the roles for active surveillance, radiation therapy, surgical therapy, androgen deprivation, as well as ablative therapy options for the treatment of prostate cancer as appropriate to his individual cancer situation. We discussed the risks and benefits of these options with regard to their impact on cancer control and also in terms of potential adverse events, complications, and impact on quality of life particularly related to urinary, bowel, and sexual function. The patient was encouraged to ask questions throughout the discussion today and all questions were answered to  his stated satisfaction. In addition, the patient was provided with and/or directed to appropriate resources and literature for further education about prostate cancer treatment options.  We discussed surgical therapy for prostate cancer including the different available surgical approaches. We discussed, in detail, the risks and expectations of surgery with regard to cancer control, urinary control, and erectile dysfunction as well as expected post operative cover he processed. Additional risks of surgery including but not limited to bleeding, infection, hernia formation, nerve damage, fistula formation, bowel/rectal injury, potentially necessitating colostomy, damage to the urinary tract resulting in urinary leakage, urethral stricture, and cardiopulmonary risk  such as myocardial infarction, stroke, death, thromboembolism etc. were explained. The risk of open surgical conversion for robotics/laparoscopic prostatectomy is also discussed.  I recommended a bone scan for completeness given his high risk cancer.  Previous CT scan performed earlier this month for the purpose of microscopic hematuria shows no evidence of metastatic disease which is reassuring.  We discussed his [facility] nomogram and risk of having disease outside the prostate, involving seminal vesicle as well as his risk for lymph node involvement.  Given his fairly significant high-volume disease at the left base involving up to 100%, I do have some concern for possible seminal vesicle invasion or bladder neck invasion.  We discussed the role of adjuvant radiation as needed for any adverse pathologic features.  Right now, he is leaning towards radiation.  He is very hesitant to have surgery is never had surgery before.  We will have him see Dr. Donella Stade and return in about 4 weeks.  We did also go ahead and discuss ADT today at length including risk and benefits.  Depending on his decision, we may go ahead and administer ADT at his next follow-up.   He will keep in touch with [place] if he decides to pursue radiation versus surgery prior to his next follow-up.  He is agreeable this plan.  - Ambulatory referral to Radiation Oncology - NM Bone Scan Whole Body; Future  2. Liver lesion Scheduled for liver MR   Return in about 4 weeks (around 09/03/2019) for possible lupron.  Hollice Espy, MD  [facility] [address], [place], [place] [address] [phone]  I spent 40 min with this patient of which greater than 50% was spent in counseling and coordination of care with the patient.

## 2019-08-08 ENCOUNTER — Telehealth: Payer: Self-pay | Admitting: Urology

## 2019-08-08 NOTE — Telephone Encounter (Signed)
[facility] MEDICATION APPROVED [id number] 08-08-19 THRU 08-07-2020 MICHELLE

## 2019-08-13 ENCOUNTER — Ambulatory Visit
Admission: RE | Admit: 2019-08-13 | Discharge: 2019-08-13 | Disposition: A | Discharge status: Home / Self Care | Payer: Medicare Other | Source: Ambulatory Visit | Attending: Urology | Admitting: Urology

## 2019-08-13 ENCOUNTER — Other Ambulatory Visit: Payer: Self-pay

## 2019-08-13 DIAGNOSIS — K769 Liver disease, unspecified: Secondary | ICD-10-CM | POA: Insufficient documentation

## 2019-08-13 LAB — POCT I-STAT CREATININE: Creatinine, Ser: 1.2 mg/dL (ref 0.61–1.24)

## 2019-08-13 MED ORDER — GADOBUTROL 1 MMOL/ML IV SOLN
9.0000 mL | Freq: Once | INTRAVENOUS | Status: AC | PRN
  Administered 2019-08-13: 9 mL via INTRAVENOUS

## 2019-08-14 ENCOUNTER — Telehealth: Payer: Self-pay | Admitting: *Deleted

## 2019-08-14 NOTE — Telephone Encounter (Addendum)
Patient informed-answered all questions and verbalized understanding  ----- Message from Hollice Espy, MD sent at 08/13/2019 11:21 AM EDT ----- Please let this patient know that his liver MRI looks fine.  He has a small hemangioma which is a collection of blood vessels and not concerning for cancer.  He also has a liver cyst which again is benign/noncancerous.  This is awesome news.  Hollice Espy, MD

## 2019-08-15 ENCOUNTER — Encounter: Payer: Self-pay | Admitting: Radiation Oncology

## 2019-08-15 ENCOUNTER — Ambulatory Visit
Admission: RE | Admit: 2019-08-15 | Discharge: 2019-08-15 | Disposition: A | Discharge status: Home / Self Care | Payer: Medicare Other | Source: Ambulatory Visit | Attending: Radiation Oncology | Admitting: Radiation Oncology

## 2019-08-15 ENCOUNTER — Other Ambulatory Visit: Payer: Self-pay

## 2019-08-15 ENCOUNTER — Other Ambulatory Visit: Payer: Self-pay | Admitting: *Deleted

## 2019-08-15 VITALS — BP 165/90 | HR 97 | Temp 98.2°F | Resp 16 | Wt 204.2 lb

## 2019-08-15 DIAGNOSIS — Z79899 Other long term (current) drug therapy: Secondary | ICD-10-CM | POA: Insufficient documentation

## 2019-08-15 DIAGNOSIS — G4730 Sleep apnea, unspecified: Secondary | ICD-10-CM | POA: Diagnosis not present

## 2019-08-15 DIAGNOSIS — E039 Hypothyroidism, unspecified: Secondary | ICD-10-CM | POA: Diagnosis not present

## 2019-08-15 DIAGNOSIS — F17210 Nicotine dependence, cigarettes, uncomplicated: Secondary | ICD-10-CM | POA: Diagnosis not present

## 2019-08-15 DIAGNOSIS — F419 Anxiety disorder, unspecified: Secondary | ICD-10-CM | POA: Insufficient documentation

## 2019-08-15 DIAGNOSIS — E785 Hyperlipidemia, unspecified: Secondary | ICD-10-CM | POA: Insufficient documentation

## 2019-08-15 DIAGNOSIS — I10 Essential (primary) hypertension: Secondary | ICD-10-CM | POA: Diagnosis not present

## 2019-08-15 DIAGNOSIS — D1809 Hemangioma of other sites: Secondary | ICD-10-CM | POA: Diagnosis not present

## 2019-08-15 DIAGNOSIS — R351 Nocturia: Secondary | ICD-10-CM | POA: Insufficient documentation

## 2019-08-15 DIAGNOSIS — C61 Malignant neoplasm of prostate: Secondary | ICD-10-CM

## 2019-08-15 NOTE — Consult Note (Signed)
NEW PATIENT EVALUATION  Name: Aniruddha Rothmeyer  MRN: [id number]  Date:   08/15/2019     DOB: 1956-05-08   This 63 y.o. male patient presents to the clinic for initial evaluation of stage IIb (T1 cN0 M0) Gleason mostly 7 (3+4) presenting with a PSA of 8.8  REFERRING PHYSICIAN: Hollice Espy, MD  CHIEF COMPLAINT:  Chief Complaint  Patient presents with  . Prostate Cancer    Initial consultation    DIAGNOSIS: The encounter diagnosis was Prostate cancer ([place]).   PREVIOUS INVESTIGATIONS:  Bone scan has been scheduled MRI scan of liver reviewed Pathology report reviewed Clinical notes reviewed  HPI: Patient is a 63 year old male originally evaluated back in 2017 for microscopic hematuria and had an elevated PSA at that time.  He was lost to follow-up.  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  Most recent PSA was 8.8 in July 2020.  His brother is status post prostate cancer with prostatectomy.  He underwent transrectal ultrasound-guided biopsies of his prostate for a prostate volume of 46.6 g.  5 of 12 cores were positive for mostly Gleason 7 (3+4).  One left core was a Gleason 8 (5+3).  He has been seen by urology and treatment options including surgical resection with robotic prostatectomy have been reviewed.  He is seen today for radiation oncology opinion.  He is fairly asymptomatic has nocturia x2 no specific bowel issues.  He is already stated he is adamant against having surgery.  PLANNED TREATMENT REGIMEN: IMRT image guided radiation therapy  PAST MEDICAL HISTORY:  has a past medical history of Anxiety (02/03/2016), Apnea, sleep (11/15/2007), Cytopenia (02/03/2016), Elevated prostate specific antigen (PSA) (11/11/2015), HLD (hyperlipidemia) (02/03/2016), Hypertension, and Subclinical hypothyroidism (11/11/2015).    PAST SURGICAL HISTORY:  Past Surgical History:  Procedure Laterality  Date  . none      FAMILY HISTORY: family history includes Prostate cancer in his brother.  SOCIAL HISTORY:  reports that he has been smoking cigarettes. He has been smoking about 1.00 pack per day. He has never used smokeless tobacco. He reports current alcohol use. He reports that he does not use drugs.  ALLERGIES: Lisinopril  MEDICATIONS:  Current Outpatient Medications  Medication Sig Dispense Refill  . levothyroxine (SYNTHROID, LEVOTHROID) 25 MCG tablet Take 25 mcg by mouth daily.  1  . losartan (COZAAR) 25 MG tablet Take 25 mg by mouth daily.    . simvastatin (ZOCOR) 10 MG tablet TAKE 1 TABLET (10 MG TOTAL) BY MOUTH NIGHTLY.  1   No current facility-administered medications for this encounter.     ECOG PERFORMANCE STATUS:  0 - Asymptomatic  REVIEW OF SYSTEMS: Patient denies any weight loss, fatigue, weakness, fever, chills or night sweats. Patient denies any loss of vision, blurred vision. Patient denies any ringing  of the ears or hearing loss. No irregular heartbeat. Patient denies heart murmur or history of fainting. Patient denies any chest pain or pain radiating to her upper extremities. Patient denies any shortness of breath, difficulty breathing at night, cough or hemoptysis. Patient denies any swelling in the lower legs. Patient denies any nausea vomiting, vomiting of blood, or coffee ground material in the vomitus. Patient denies any stomach pain. Patient states has had normal bowel movements no significant constipation or diarrhea. Patient denies any dysuria, hematuria or significant nocturia. Patient denies any problems walking, swelling in the joints or loss of balance. Patient denies any skin  changes, loss of hair or loss of weight. Patient denies any excessive worrying or anxiety or significant depression. Patient denies any problems with insomnia. Patient denies excessive thirst, polyuria, polydipsia. Patient denies any swollen glands, patient denies easy bruising or easy  bleeding. Patient denies any recent infections, allergies or URI. Patient "s visual fields have not changed significantly in recent time.   PHYSICAL EXAM: BP (!) 165/90 (BP Location: Left Arm, Patient Position: Sitting)   Pulse 97   Temp 98.2 F (36.8 C) (Tympanic)   Resp 16   Wt 204 lb 3.2 oz (92.6 kg)   BMI 29.30 kg/m  On rectal exam rectal sphincter tone is good prostate is smooth slight induration of the left lateral lobe also though sulcus is preserved bilaterally.  No other rectal abnormality is identified.  Well-developed well-nourished patient in NAD. HEENT reveals PERLA, EOMI, discs not visualized.  Oral cavity is clear. No oral mucosal lesions are identified. Neck is clear without evidence of cervical or supraclavicular adenopathy. Lungs are clear to A&P. Cardiac examination is essentially unremarkable with regular rate and rhythm without murmur rub or thrill. Abdomen is benign with no organomegaly or masses noted. Motor sensory and DTR levels are equal and symmetric in the upper and lower extremities. Cranial nerves II through XII are grossly intact. Proprioception is intact. No peripheral adenopathy or edema is identified. No motor or sensory levels are noted. Crude visual fields are within normal range.  LABORATORY DATA: Pathology report reviewed    RADIOLOGY RESULTS: MRI scan of liver reviewed bone scan to be reviewed prior to starting treatment   IMPRESSION: Stage IIb mostly Gleason 7 (3+4) adenocarcinoma the prostate in 63 year old male  PLAN: I have gone over treatment recommendations with the patient including [facility] nomogram based on mostly Gleason 7 (3+4) adenocarcinoma.  He has a 52% chance of organ confined disease with only 5% chance of lymph node involvement.  I have gone over both risks and benefits of external beam radiation therapy as well as I-125 interstitial implant.  Patient is against surgery and has elected to proceed with image guided IMRT radiation  therapy.  Would plan on delivering 8000 cGy to his prostate.  Would not elect to treat his pelvic nodes.  I believe he would benefit from androgen deprivation therapy and that will be started by urology.  I have also asked urology to place fiducial markers in his prostate for daily image guided treatment.  We will review his bone scan prior to initiating treatment.  I have personally set up and ordered CT simulation after his markers are placed.  Risks and benefits of treatment including increased lower urinary tract symptoms diarrhea fatigue alteration blood counts and skin reaction all were reviewed with the patient.  He seems to comprehend my treatment plan well.  I would like to take this opportunity to thank you for allowing me to participate in the care of your patient.Noreene Filbert, MD

## 2019-08-16 ENCOUNTER — Telehealth: Payer: Self-pay | Admitting: Urology

## 2019-08-16 NOTE — Telephone Encounter (Signed)
-----   Message from Daiva Huge, RN sent at 08/15/2019 11:46 AM EDT ----- Regarding: RE: Gold seed markers Yes he has the markers and yes he will need instructions.    Thank you!  Ana  ----- Message ----- From: Benard Halsted Sent: 08/15/2019  11:40 AM EDT To: Daiva Huge, RN Subject: RE: Girtha Rm seed markers                          Yes I can change this to the gold seed does he have the markers and do I need to call him with instructions? ----- Message ----- From: Daiva Huge, RN Sent: 08/15/2019  11:18 AM EDT To: Lin Givens, RN Subject: Daphene Jaeger markers                              Mr. Duchateau already has an appointment with Dr. Erlene Quan on 10/20.   He needs to be set up for gold seed marker placement and Lupron/Eligard.   Can you let me know if this appointment will remain the same or change so I can get him set up for Simulation.     Thanks,   [facility]

## 2019-08-16 NOTE — Telephone Encounter (Signed)
Mailed instructions to patient  Called and went over with patient   Chad Cross

## 2019-08-20 ENCOUNTER — Encounter
Admission: RE | Admit: 2019-08-20 | Discharge: 2019-08-20 | Disposition: A | Discharge status: Home / Self Care | Payer: Medicare Other | Source: Ambulatory Visit | Attending: Urology | Admitting: Urology

## 2019-08-20 ENCOUNTER — Other Ambulatory Visit: Payer: Self-pay

## 2019-08-20 DIAGNOSIS — C61 Malignant neoplasm of prostate: Secondary | ICD-10-CM

## 2019-08-20 MED ORDER — TECHNETIUM TC 99M MEDRONATE IV KIT
20.0000 | PACK | Freq: Once | INTRAVENOUS | Status: AC | PRN
  Administered 2019-08-20: 10:00:00 22.379 via INTRAVENOUS

## 2019-09-03 ENCOUNTER — Other Ambulatory Visit: Payer: Self-pay

## 2019-09-03 ENCOUNTER — Encounter: Payer: Self-pay | Admitting: Urology

## 2019-09-03 ENCOUNTER — Ambulatory Visit: Payer: Medicare Other | Admitting: Urology

## 2019-09-03 VITALS — BP 162/98 | HR 112 | Ht 70.0 in | Wt 205.0 lb

## 2019-09-03 DIAGNOSIS — C61 Malignant neoplasm of prostate: Secondary | ICD-10-CM

## 2019-09-03 MED ORDER — LEUPROLIDE ACETATE (6 MONTH) 45 MG ~~LOC~~ KIT
45.0000 mg | PACK | Freq: Once | SUBCUTANEOUS | Status: AC
  Administered 2019-09-03: 45 mg via SUBCUTANEOUS

## 2019-09-03 NOTE — Progress Notes (Signed)
09/03/2019 2:57 PM   Chad Cross 16-Sep-1956 [id number]  Referring provider: [name], NP [address] [place],  [place] [address]  Chief Complaint  Patient presents with  . Prostate Cancer    HPI: 63 year old male with newly diagnosed high risk prostate cancer returns today for follow-up.  He is ultimately active to undergo ADT with IMRT.  He returns today specifically to discuss ADT in further detail and possibly receive his first dose.  Please see previous notes for his cancer details.  In the interim, he is undergone staging in the form of CT hematuria evaluation which showed no evidence of metastatic disease, prostamegaly incidental.  He there was an incidental liver lesion which was followed up with an MRI and and felt to be consistent with a benign hemangioma.  He also underwent a bone scan on 08/21/2019 which showed focal asymmetric uptake in the right manubrium possibly related to degenerative changes of the right sternoclavicular joint without any other specific focal areas of radiotracer uptake.  He is very anxious today about his cancer diagnosis.  PMH: Past Medical History:  Diagnosis Date  . Anxiety 02/03/2016  . Apnea, sleep 11/15/2007   Overview:  Has CPAP but is unable to tolerate the mask due to anxiety.   . Cytopenia 02/03/2016  . Elevated prostate specific antigen (PSA) 11/11/2015  . HLD (hyperlipidemia) 02/03/2016  . Hypertension   . Subclinical hypothyroidism 11/11/2015    Surgical History: Past Surgical History:  Procedure Laterality Date  . none      Home Medications:  Allergies as of 09/03/2019      Reactions   Lisinopril Cough      Medication List       Accurate as of September 03, 2019 11:59 PM. If you have any questions, ask your nurse or doctor.        levothyroxine 25 MCG tablet Commonly known as: SYNTHROID Take 25 mcg by mouth daily.   losartan 25 MG tablet Commonly known as: COZAAR Take 25 mg by mouth daily.    simvastatin 10 MG tablet Commonly known as: ZOCOR TAKE 1 TABLET (10 MG TOTAL) BY MOUTH NIGHTLY.       Allergies:  Allergies  Allergen Reactions  . Lisinopril Cough    Family History: Family History  Problem Relation Age of Onset  . Prostate cancer Brother   . Bladder Cancer Neg Hx   . Kidney cancer Neg Hx     Social History:  reports that he has been smoking cigarettes. He has been smoking about 1.00 pack per day. He has never used smokeless tobacco. He reports current alcohol use. He reports that he does not use drugs.  ROS: Review systems negative other than as per HPI.  Physical Exam: BP (!) 162/98   Pulse (!) 112   Ht 5\' 10"  (1.778 m)   Wt 205 lb (93 kg)   BMI 29.41 kg/m   Constitutional:  Alert and oriented, No acute distress. HEENT: [place] AT, moist mucus membranes.  Trachea midline, no masses. Cardiovascular: No clubbing, cyanosis, or edema. Respiratory: Normal respiratory effort, no increased work of breathing. Skin: No rashes, bruises or suspicious lesions. Neurologic: Grossly intact, no focal deficits, moving all 4 extremities. Psychiatric: Normal mood and affect.  Laboratory Data: Lab Results  Component Value Date   CREATININE 1.20 08/13/2019    Pertinent Imaging: CLINICAL DATA:  Prostate cancer, PSA 10.5  EXAM: NUCLEAR MEDICINE WHOLE BODY BONE SCAN  TECHNIQUE: Whole body anterior and posterior images  were obtained approximately 3 hours after intravenous injection of radiopharmaceutical.  RADIOPHARMACEUTICALS:  22.379 mCi Technetium-35m MDP IV  COMPARISON:  None  Radiographic correlation: CT abdomen and pelvis 06/27/2019  FINDINGS: Uptake at the shoulders, LEFT elbow, and knees, typically degenerative.  Mild increased tracer accumulation at the RIGHT manubrium superiorly, potentially related to RIGHT sternoclavicular joint but a metastatic focus is not excluded.  No additional sites of abnormal osseous tracer accumulation are  identified.  Expected urinary tract and soft tissue distribution of tracer.  IMPRESSION: Focus of abnormal increased tracer accumulation asymmetrically at the RIGHT superior manubrium, may be related to degenerative changes of the RIGHT sternoclavicular joint though a metastatic focus is not excluded; consider CT correlation.  No additional concerning scintigraphic abnormalities.   Electronically Signed   By: Lavonia Dana M.D.   On: 08/21/2019 08:17  Bone scan personally reviewed today, changes related to right sternoclavicular joint likely secondary to arthritis/injury especially given lack of other findings and negative CT scan.  Assessment & Plan:    1. Prostate cancer ([place]) High risk prostate cancer electing ADT/IMRT  We discussed ADT again today at length.  We discussed possible side effects including central obesity, loss of muscle mass, loss of bone mass, hot flashes, fatigue, sexual side effects, and cardiovascular risks.  We discussed local complications including nodule to the skin, skin discomfort and irritation.  All questions were answered.  He is agreeable to receive this today, will administer 69-month Depo.  We also reviewed bone health recommendations.  He will begin calcium vitamin D supplementation.  He will return next week for placement of gold seed markers. - leuprolide (6 Month) (ELIGARD) injection 45 mg   Return for return for gold seed marker.  Hollice Espy, MD  [facility] [address], [place], [place] [address] [phone]

## 2019-09-03 NOTE — Patient Instructions (Signed)
Discussed importance of bone health on ADT, recommend 1000-1200 mg daily calcium suppliment and [phone] IU vit D daily.  Also encouraged weight being exercises and cardiovascular health.   Leuprolide depot injection What is this medicine? LEUPROLIDE (loo PROE lide) is a man-made protein that acts like a natural hormone in the body. It decreases testosterone in men and decreases estrogen in women. In men, this medicine is used to treat advanced prostate cancer. In women, some forms of this medicine may be used to treat endometriosis, uterine fibroids, or other male hormone-related problems. This medicine may be used for other purposes; ask your health care provider or pharmacist if you have questions. COMMON BRAND NAME(S): Eligard, Fensolv, Lupron Depot, Lupron Depot-Ped, Viadur What should I tell my health care provider before I take this medicine? They need to know if you have any of these conditions:  diabetes  heart disease or previous heart attack  high blood pressure  high cholesterol  mental illness  osteoporosis  pain or difficulty passing urine  seizures  spinal cord metastasis  stroke  suicidal thoughts, plans, or attempt; a previous suicide attempt by you or a family member  tobacco smoker  unusual vaginal bleeding (women)  an unusual or allergic reaction to leuprolide, benzyl alcohol, other medicines, foods, dyes, or preservatives  pregnant or trying to get pregnant  breast-feeding How should I use this medicine? This medicine is for injection into a muscle or for injection under the skin. It is given by a health care professional in a hospital or clinic setting. The specific product will determine how it will be given to you. Make sure you understand which product you receive and how often you will receive it. Talk to your pediatrician regarding the use of this medicine in children. Special care may be needed. Overdosage: If you think you have taken too  much of this medicine contact a poison control center or emergency room at once. NOTE: This medicine is only for you. Do not share this medicine with others. What if I miss a dose? It is important not to miss a dose. Call your doctor or health care professional if you are unable to keep an appointment. Depot injections: Depot injections are given either once-monthly, every 12 weeks, every 16 weeks, or every 24 weeks depending on the product you are prescribed. The product you are prescribed will be based on if you are male or male, and your condition. Make sure you understand your product and dosing. What may interact with this medicine? Do not take this medicine with any of the following medications:  chasteberry This medicine may also interact with the following medications:  herbal or dietary supplements, like black cohosh or DHEA  male hormones, like estrogens or progestins and birth control pills, patches, rings, or injections  male hormones, like testosterone This list may not describe all possible interactions. Give your health care provider a list of all the medicines, herbs, non-prescription drugs, or dietary supplements you use. Also tell them if you smoke, drink alcohol, or use illegal drugs. Some items may interact with your medicine. What should I watch for while using this medicine? Visit your doctor or health care professional for regular checks on your progress. During the first weeks of treatment, your symptoms may get worse, but then will improve as you continue your treatment. You may get hot flashes, increased bone pain, increased difficulty passing urine, or an aggravation of nerve symptoms. Discuss these effects with your doctor or health care  professional, some of them may improve with continued use of this medicine. Male patients may experience a menstrual cycle or spotting during the first months of therapy with this medicine. If this continues, contact your doctor  or health care professional. This medicine may increase blood sugar. Ask your healthcare provider if changes in diet or medicines are needed if you have diabetes. What side effects may I notice from receiving this medicine? Side effects that you should report to your doctor or health care professional as soon as possible:  allergic reactions like skin rash, itching or hives, swelling of the face, lips, or tongue  breathing problems  chest pain  depression or memory disorders  pain in your legs or groin  pain at site where injected or implanted  seizures  severe headache  signs and symptoms of high blood sugar such as being more thirsty or hungry or having to urinate more than normal. You may also feel very tired or have blurry vision  swelling of the feet and legs  suicidal thoughts or other mood changes  visual changes  vomiting Side effects that usually do not require medical attention (report to your doctor or health care professional if they continue or are bothersome):  breast swelling or tenderness  decrease in sex drive or performance  diarrhea  hot flashes  loss of appetite  muscle, joint, or bone pains  nausea  redness or irritation at site where injected or implanted  skin problems or acne This list may not describe all possible side effects. Call your doctor for medical advice about side effects. You may report side effects to FDA at 1-800-FDA-1088. Where should I keep my medicine? This drug is given in a hospital or clinic and will not be stored at home. NOTE: This sheet is a summary. It may not cover all possible information. If you have questions about this medicine, talk to your doctor, pharmacist, or health care provider.  2020 Elsevier/Gold Standard (2018-08-30 09:27:03)

## 2019-09-05 ENCOUNTER — Ambulatory Visit: Payer: Medicare Other

## 2019-09-10 ENCOUNTER — Ambulatory Visit (INDEPENDENT_AMBULATORY_CARE_PROVIDER_SITE_OTHER): Payer: Medicare Other | Admitting: Urology

## 2019-09-10 ENCOUNTER — Other Ambulatory Visit: Payer: Self-pay

## 2019-09-10 ENCOUNTER — Encounter: Payer: Self-pay | Admitting: Urology

## 2019-09-10 VITALS — BP 178/100 | HR 116 | Ht 70.0 in | Wt 205.0 lb

## 2019-09-10 DIAGNOSIS — C61 Malignant neoplasm of prostate: Secondary | ICD-10-CM | POA: Diagnosis not present

## 2019-09-10 MED ORDER — GENTAMICIN SULFATE 40 MG/ML IJ SOLN
80.0000 mg | Freq: Once | INTRAMUSCULAR | Status: AC
Start: 2019-09-10 — End: 2019-09-10
  Administered 2019-09-10: 80 mg via INTRAMUSCULAR

## 2019-09-10 MED ORDER — LEVOFLOXACIN 500 MG PO TABS
500.0000 mg | ORAL_TABLET | Freq: Once | ORAL | Status: AC
  Administered 2019-09-10: 500 mg via ORAL

## 2019-09-10 NOTE — Progress Notes (Signed)
09/10/19  CC: gold seed markers  HPI: 63 y.o. male with prostate cancer who presents today for placement of fiducial seed markers in anticipation of his upcoming IMRT with Dr. Baruch Gouty.  He received Eligard last week without hot flashes or any other side effects.  Prostate Gold Seed Marker Placement Procedure   Informed consent was obtained after discussing risks/benefits of the procedure.  A time out was performed to ensure correct patient identity.  Pre-Procedure: - Gentamicin given prophylactically - PO Levaquin 500 mg also given today  Procedure: -Lidocaine jelly was administered per rectum -Rectal ultrasound probe was placed without difficulty and the prostate visualized - 3 fiducial gold seed markers placed, one at right base, one at left base, one at apex of prostate gland under transrectal ultrasound guidance  Post-Procedure: - Patient tolerated the procedure well - He was counseled to seek immediate medical attention if experiences any severe pain, significant bleeding, or fevers  Return in about 6 months (around 03/10/2020) for MD follow up psa prior.  Hollice Espy, MD

## 2019-09-11 ENCOUNTER — Other Ambulatory Visit: Payer: Self-pay

## 2019-09-12 ENCOUNTER — Other Ambulatory Visit: Payer: Self-pay

## 2019-09-12 ENCOUNTER — Ambulatory Visit
Admission: RE | Admit: 2019-09-12 | Discharge: 2019-09-12 | Disposition: A | Discharge status: Home / Self Care | Payer: Medicare Other | Source: Ambulatory Visit | Attending: Radiation Oncology | Admitting: Radiation Oncology

## 2019-09-12 ENCOUNTER — Encounter: Payer: Self-pay | Admitting: Emergency Medicine

## 2019-09-12 ENCOUNTER — Emergency Department: Payer: Medicare Other

## 2019-09-12 ENCOUNTER — Inpatient Hospital Stay
Admission: EM | Admit: 2019-09-12 | Discharge: 2019-09-15 | DRG: 872 | Disposition: A | Discharge status: Home / Self Care | Payer: Medicare Other | Attending: Internal Medicine | Admitting: Internal Medicine

## 2019-09-12 DIAGNOSIS — E039 Hypothyroidism, unspecified: Secondary | ICD-10-CM | POA: Diagnosis present

## 2019-09-12 DIAGNOSIS — Z20828 Contact with and (suspected) exposure to other viral communicable diseases: Secondary | ICD-10-CM | POA: Diagnosis present

## 2019-09-12 DIAGNOSIS — R509 Fever, unspecified: Secondary | ICD-10-CM

## 2019-09-12 DIAGNOSIS — I10 Essential (primary) hypertension: Secondary | ICD-10-CM | POA: Diagnosis present

## 2019-09-12 DIAGNOSIS — Z8546 Personal history of malignant neoplasm of prostate: Secondary | ICD-10-CM

## 2019-09-12 DIAGNOSIS — G4730 Sleep apnea, unspecified: Secondary | ICD-10-CM | POA: Diagnosis present

## 2019-09-12 DIAGNOSIS — Z79890 Hormone replacement therapy (postmenopausal): Secondary | ICD-10-CM | POA: Diagnosis not present

## 2019-09-12 DIAGNOSIS — G4733 Obstructive sleep apnea (adult) (pediatric): Secondary | ICD-10-CM | POA: Diagnosis present

## 2019-09-12 DIAGNOSIS — N390 Urinary tract infection, site not specified: Secondary | ICD-10-CM | POA: Diagnosis present

## 2019-09-12 DIAGNOSIS — R319 Hematuria, unspecified: Principal | ICD-10-CM

## 2019-09-12 DIAGNOSIS — Z79899 Other long term (current) drug therapy: Secondary | ICD-10-CM | POA: Diagnosis not present

## 2019-09-12 DIAGNOSIS — E785 Hyperlipidemia, unspecified: Secondary | ICD-10-CM | POA: Diagnosis present

## 2019-09-12 DIAGNOSIS — Z888 Allergy status to other drugs, medicaments and biological substances status: Secondary | ICD-10-CM

## 2019-09-12 DIAGNOSIS — F17210 Nicotine dependence, cigarettes, uncomplicated: Secondary | ICD-10-CM | POA: Diagnosis present

## 2019-09-12 DIAGNOSIS — Z8042 Family history of malignant neoplasm of prostate: Secondary | ICD-10-CM | POA: Diagnosis not present

## 2019-09-12 DIAGNOSIS — C61 Malignant neoplasm of prostate: Secondary | ICD-10-CM | POA: Diagnosis not present

## 2019-09-12 DIAGNOSIS — A419 Sepsis, unspecified organism: Principal | ICD-10-CM | POA: Diagnosis present

## 2019-09-12 DIAGNOSIS — N3000 Acute cystitis without hematuria: Secondary | ICD-10-CM | POA: Diagnosis not present

## 2019-09-12 DIAGNOSIS — F419 Anxiety disorder, unspecified: Secondary | ICD-10-CM | POA: Diagnosis present

## 2019-09-12 DIAGNOSIS — A4151 Sepsis due to Escherichia coli [E. coli]: Secondary | ICD-10-CM | POA: Diagnosis not present

## 2019-09-12 HISTORY — DX: Malignant neoplasm of prostate: C61

## 2019-09-12 HISTORY — DX: Disease of blood and blood-forming organs, unspecified: D75.9

## 2019-09-12 LAB — CBC WITH DIFFERENTIAL/PLATELET
Abs Immature Granulocytes: 0.04 10*3/uL (ref 0.00–0.07)
Basophils Absolute: 0 10*3/uL (ref 0.0–0.1)
Basophils Relative: 0 %
Eosinophils Absolute: 0 10*3/uL (ref 0.0–0.5)
Eosinophils Relative: 0 %
HCT: 42.1 % (ref 39.0–52.0)
Hemoglobin: 14.5 g/dL (ref 13.0–17.0)
Immature Granulocytes: 0 %
Lymphocytes Relative: 9 %
Lymphs Abs: 0.9 10*3/uL (ref 0.7–4.0)
MCH: 31.4 pg (ref 26.0–34.0)
MCHC: 34.4 g/dL (ref 30.0–36.0)
MCV: 91.1 fL (ref 80.0–100.0)
Monocytes Absolute: 0.8 10*3/uL (ref 0.1–1.0)
Monocytes Relative: 9 %
Neutro Abs: 7.6 10*3/uL (ref 1.7–7.7)
Neutrophils Relative %: 82 %
Platelets: 125 10*3/uL — ABNORMAL LOW (ref 150–400)
RBC: 4.62 MIL/uL (ref 4.22–5.81)
RDW: 12 % (ref 11.5–15.5)
WBC: 9.4 10*3/uL (ref 4.0–10.5)
nRBC: 0 % (ref 0.0–0.2)

## 2019-09-12 LAB — URINALYSIS, ROUTINE W REFLEX MICROSCOPIC
Bilirubin Urine: NEGATIVE
Glucose, UA: NEGATIVE mg/dL
Ketones, ur: NEGATIVE mg/dL
Nitrite: NEGATIVE
Protein, ur: NEGATIVE mg/dL
Specific Gravity, Urine: 1.001 — ABNORMAL LOW (ref 1.005–1.030)
pH: 6 (ref 5.0–8.0)

## 2019-09-12 LAB — COMPREHENSIVE METABOLIC PANEL
ALT: 18 U/L (ref 0–44)
AST: 20 U/L (ref 15–41)
Albumin: 4.3 g/dL (ref 3.5–5.0)
Alkaline Phosphatase: 76 U/L (ref 38–126)
Anion gap: 10 (ref 5–15)
BUN: 13 mg/dL (ref 8–23)
CO2: 23 mmol/L (ref 22–32)
Calcium: 9.5 mg/dL (ref 8.9–10.3)
Chloride: 101 mmol/L (ref 98–111)
Creatinine, Ser: 1.22 mg/dL (ref 0.61–1.24)
GFR calc Af Amer: >60 mL/min (ref >60)
GFR calc non Af Amer: >60 mL/min (ref >60)
Glucose, Bld: 126 mg/dL — ABNORMAL HIGH (ref 70–99)
Potassium: 3.8 mmol/L (ref 3.5–5.1)
Sodium: 134 mmol/L — ABNORMAL LOW (ref 135–145)
Total Bilirubin: 1.3 mg/dL — ABNORMAL HIGH (ref 0.3–1.2)
Total Protein: 7.7 g/dL (ref 6.5–8.1)

## 2019-09-12 LAB — PROTIME-INR
INR: 1.2 (ref 0.8–1.2)
Prothrombin Time: 15.2 seconds (ref 11.4–15.2)

## 2019-09-12 LAB — LACTIC ACID, PLASMA: Lactic Acid, Venous: 1.1 mmol/L (ref 0.5–1.9)

## 2019-09-12 MED ORDER — SODIUM CHLORIDE 0.9 % IV SOLN
2.0000 g | Freq: Three times a day (TID) | INTRAVENOUS | Status: DC
  Administered 2019-09-13 (×3): 2 g via INTRAVENOUS
  Filled 2019-09-12 (×5): qty 2

## 2019-09-12 MED ORDER — VANCOMYCIN HCL 10 G IV SOLR
1750.0000 mg | INTRAVENOUS | Status: DC
  Administered 2019-09-13: 1750 mg via INTRAVENOUS
  Filled 2019-09-12: qty 1750

## 2019-09-12 MED ORDER — LABETALOL HCL 5 MG/ML IV SOLN: 10.0000 mg | INTRAVENOUS | Status: DC | PRN

## 2019-09-12 MED ORDER — LACTATED RINGERS IV BOLUS
1000.0000 mL | Freq: Once | INTRAVENOUS | Status: AC
  Administered 2019-09-12: 1000 mL via INTRAVENOUS

## 2019-09-12 MED ORDER — METRONIDAZOLE IN NACL 5-0.79 MG/ML-% IV SOLN
500.0000 mg | Freq: Once | INTRAVENOUS | Status: AC
  Administered 2019-09-12: 500 mg via INTRAVENOUS
  Filled 2019-09-12: qty 100

## 2019-09-12 MED ORDER — SODIUM CHLORIDE 0.9 % IV SOLN
2.0000 g | Freq: Once | INTRAVENOUS | Status: AC
  Administered 2019-09-12: 2 g via INTRAVENOUS
  Filled 2019-09-12 (×2): qty 2

## 2019-09-12 MED ORDER — VANCOMYCIN HCL IN DEXTROSE 1-5 GM/200ML-% IV SOLN
1000.0000 mg | Freq: Once | INTRAVENOUS | Status: AC
  Administered 2019-09-12: 1000 mg via INTRAVENOUS
  Filled 2019-09-12: qty 200

## 2019-09-12 NOTE — Progress Notes (Signed)
CODE SEPSIS - PHARMACY COMMUNICATION  **Broad Spectrum Antibiotics should be administered within 1 hour of Sepsis diagnosis**  Time Code Sepsis Called/Page Received: 10/29  1815  Antibiotics Ordered: Cefepime, Vanc  Time of 1st antibiotic administration: 1908  Additional action taken by pharmacy:    If necessary, Name of Provider/Nurse Contacted:      Noralee Space ,PharmD Clinical Pharmacist  09/12/2019  7:32 PM

## 2019-09-12 NOTE — Progress Notes (Signed)
PHARMACY -  BRIEF ANTIBIOTIC NOTE   Pharmacy has received consult(s) for Vancomycin, Cefepime from an ED provider.  The patient's profile has been reviewed for ht/wt/allergies/indication/available labs.    One time order(s) placed for Cefepime and Vancomycin by ED MD  Further antibiotics/pharmacy consults should be ordered by admitting physician if indicated.                       Thank you, Charniece Venturino A 09/12/2019  6:23 PM

## 2019-09-12 NOTE — ED Notes (Signed)
Pt provided with a meal at this time. Pt able to feed self.

## 2019-09-12 NOTE — ED Notes (Signed)
First RN Note: Pt had "fiducial seed markers" for prostate cancer placed on 10/27. Pt states intermittent fever since then. Pt A&O x 4. NAD noted at this time.

## 2019-09-12 NOTE — H&P (Signed)
[facility] [place] NAME: Chad Cross    MR#:  [id number]  DATE OF BIRTH:  09/27/1956  DATE OF ADMISSION:  09/12/2019  PRIMARY CARE PHYSICIAN: Dayton Martes Victoriano Lain, NP   REQUESTING/REFERRING PHYSICIAN: Kerman Passey, MD  CHIEF COMPLAINT:   Chief Complaint  Patient presents with  . Post-op Problem    HISTORY OF PRESENT ILLNESS:  Chad Cross  is a 63 y.o. male who presents with chief complaint as above.  Patient presents to the ED with a complaint of fever.  He states that he had prostate seeding performed by urology 2 days ago.  He has felt fine since that time, but last night developed some chills, and then today developed a fever.  He was told to come to the emergency department if he had fever, so he did so.  He also states that he has had some intermittent hematuria, though no gross hematuria, only intermittent mild red tinged urine on evaluation.  Here he meets sepsis criteria, and UA looks suspicious for UTI, which would also be consistent after recent instrumentation.  Hospitalist were called for admission  PAST MEDICAL HISTORY:   Past Medical History:  Diagnosis Date  . Anxiety 02/03/2016  . Apnea, sleep 11/15/2007   Overview:  Has CPAP but is unable to tolerate the mask due to anxiety.   . Cytopenia 02/03/2016  . Elevated prostate specific antigen (PSA) 11/11/2015  . HLD (hyperlipidemia) 02/03/2016  . Hypertension   . Prostate cancer ([place])   . Subclinical hypothyroidism 11/11/2015     PAST SURGICAL HISTORY:   Past Surgical History:  Procedure Laterality Date  . none       SOCIAL HISTORY:   Social History   Tobacco Use  . Smoking status: Current Every Day Smoker    Packs/day: 1.00    Types: Cigarettes  . Smokeless tobacco: Never Used  Substance Use Topics  . Alcohol use: Yes    Alcohol/week: 0.0 standard drinks     FAMILY HISTORY:   Family History  Problem Relation Age of Onset  . Prostate cancer  Brother   . Bladder Cancer Neg Hx   . Kidney cancer Neg Hx      DRUG ALLERGIES:   Allergies  Allergen Reactions  . Lisinopril Cough    MEDICATIONS AT HOME:   Prior to Admission medications   Medication Sig Start Date End Date Taking? Authorizing Provider  levothyroxine (SYNTHROID, LEVOTHROID) 25 MCG tablet Take 25 mcg by mouth daily. 12/28/15  Yes [provider]  losartan (COZAAR) 25 MG tablet Take 25 mg by mouth daily.   Yes [provider]  simvastatin (ZOCOR) 10 MG tablet TAKE 1 TABLET (10 MG TOTAL) BY MOUTH NIGHTLY. 12/28/15  Yes [provider]    REVIEW OF SYSTEMS:  Review of Systems  Constitutional: Positive for chills and fever. Negative for malaise/fatigue and weight loss.  HENT: Negative for ear pain, hearing loss and tinnitus.   Eyes: Negative for blurred vision, double vision, pain and redness.  Respiratory: Negative for cough, hemoptysis and shortness of breath.   Cardiovascular: Negative for chest pain, palpitations, orthopnea and leg swelling.  Gastrointestinal: Negative for abdominal pain, constipation, diarrhea, nausea and vomiting.  Genitourinary: Positive for hematuria. Negative for dysuria and frequency.  Musculoskeletal: Negative for back pain, joint pain and neck pain.  Skin:       No acne, rash, or lesions  Neurological: Negative for dizziness, tremors, focal weakness and weakness.  Endo/Heme/Allergies:  Negative for polydipsia. Does not bruise/bleed easily.  Psychiatric/Behavioral: Negative for depression. The patient is not nervous/anxious and does not have insomnia.      VITAL SIGNS:   Vitals:   09/12/19 1900 09/12/19 1930 09/12/19 1944 09/12/19 2030  BP: 140/81 138/74  (!) 142/76  Pulse: (!) 112 (!) 108  98  Resp:  20  (!) 21  Temp:      TempSrc:      SpO2: 100% 99%  100%  Weight:   93 kg   Height:   5\' 10"  (1.778 m)    Wt Readings from Last 3 Encounters:  09/12/19 93 kg  09/10/19 93 kg  09/03/19 93 kg     PHYSICAL EXAMINATION:  Physical Exam  Vitals reviewed. Constitutional: He is oriented to person, place, and time. He appears well-developed and well-nourished. No distress.  HENT:  Head: Normocephalic and atraumatic.  Mouth/Throat: Oropharynx is clear and moist.  Eyes: Pupils are equal, round, and reactive to light. Conjunctivae and EOM are normal. No scleral icterus.  Neck: Normal range of motion. Neck supple. No JVD present. No thyromegaly present.  Cardiovascular: Regular rhythm and intact distal pulses. Exam reveals no gallop and no friction rub.  No murmur heard. Tachycardic  Respiratory: Effort normal and breath sounds normal. No respiratory distress. He has no wheezes. He has no rales.  GI: Soft. Bowel sounds are normal. He exhibits no distension. There is no abdominal tenderness.  Musculoskeletal: Normal range of motion.        General: No edema.     Comments: No arthritis, no gout  Lymphadenopathy:    He has no cervical adenopathy.  Neurological: He is alert and oriented to person, place, and time. No cranial nerve deficit.  No dysarthria, no aphasia  Skin: Skin is warm and dry. No rash noted. No erythema.  Psychiatric: He has a normal mood and affect. His behavior is normal. Judgment and thought content normal.    LABORATORY PANEL:   CBC Recent Labs  Lab 09/12/19 1727  WBC 9.4  HGB 14.5  HCT 42.1  PLT 125*   ------------------------------------------------------------------------------------------------------------------  Chemistries  Recent Labs  Lab 09/12/19 1727  NA 134*  K 3.8  CL 101  CO2 23  GLUCOSE 126*  BUN 13  CREATININE 1.22  CALCIUM 9.5  AST 20  ALT 18  ALKPHOS 76  BILITOT 1.3*   ------------------------------------------------------------------------------------------------------------------  Cardiac Enzymes No results for input(s): TROPONINI in the last 168  hours. ------------------------------------------------------------------------------------------------------------------  RADIOLOGY:  Dg Chest 2 View  Result Date: 09/12/2019 CLINICAL DATA:  Intermittent fever EXAM: CHEST - 2 VIEW COMPARISON:  None. FINDINGS: The heart size and mediastinal contours are within normal limits. Both lungs are clear. The visualized skeletal structures are unremarkable. IMPRESSION: No active cardiopulmonary disease. Electronically Signed   By: Donavan Foil M.D.   On: 09/12/2019 18:01    EKG:   Orders placed or performed during the hospital encounter of 09/12/19  . ED EKG 12-Lead  . ED EKG 12-Lead    IMPRESSION AND PLAN:  Principal Problem:   Sepsis ([place]) -sepsis is due to UTI after instrumentation for prostate seeding.  Lactic acid is within normal limits.  Blood pressure stable, even high.  Blood and urine cultures were sent.  IV antibiotics were given. Active Problems:   UTI (urinary tract infection) -IV antibiotics and culture as above   HTN (hypertension) -mildly elevated, continue home dose antihypertensives plus additional as needed antihypertensives for blood pressure goal less than 160/100  Prostate cancer [facility]) -recently underwent instrumentation for seeding as treatment per urology.  Treatment of UTI as above   HLD (hyperlipidemia) -home dose antilipid   OSA (obstructive sleep apnea) -CPAP nightly   Hypothyroidism -home dose thyroid replacement  Chart review performed and case discussed with ED provider. Labs, imaging and/or ECG reviewed by provider and discussed with patient/family. Management plans discussed with the patient and/or family.  COVID-19 status: Pending  DVT PROPHYLAXIS: SubQ lovenox   GI PROPHYLAXIS:  None  ADMISSION STATUS: Inpatient     CODE STATUS: Full  TOTAL TIME TAKING CARE OF THIS PATIENT: 45 minutes.   This patient was evaluated in the context of the global COVID-19 pandemic, which necessitated consideration  that the patient might be at risk for infection with the SARS-CoV-2 virus that causes COVID-19. Institutional protocols and algorithms that pertain to the evaluation of patients at risk for COVID-19 are in a state of rapid change based on information released by regulatory bodies including the CDC and federal and state organizations. These policies and algorithms were followed to the best of this provider's knowledge to date during the patient's care at this facility.  Ethlyn Daniels 09/12/2019, 9:09 PM  [facility]  Office  [phone]  CC: Primary care physician; [name], NP  Note:  This document was prepared using Dragon voice recognition software and may include unintentional dictation errors.

## 2019-09-12 NOTE — ED Notes (Signed)
MD Willis at bedside. 

## 2019-09-12 NOTE — ED Notes (Signed)
Pt on the phone speaking to family at this time.

## 2019-09-12 NOTE — ED Triage Notes (Signed)
Pt had prostate procedure " seeds" for radiation on 10/27 and states he has been having intermit fevers and was told to come to ED. PT 101.1 currently. PT HR 130s

## 2019-09-12 NOTE — ED Notes (Signed)
Pt provided with a meal and cranberry juice per pt's request. Pt ate 100% of his meal.

## 2019-09-12 NOTE — ED Notes (Signed)
Pt denies Cp/SHOB/N/V. Pt st he was diagnosed with prostate cancer and he had a procedure for his prostate where he had "seeds" marking placed on 10/27. Pt was told by PCP to come to ED if he experience fever at home. Pt arrives from triage to room 1H ambulatory with a steady gait.

## 2019-09-12 NOTE — ED Notes (Signed)
Spoke with Dr. Charna Archer regarding pt's care, VORB for 1L LR bolus.

## 2019-09-12 NOTE — ED Notes (Signed)
Pt ambulatory to bathroom with a steady gait at this time.

## 2019-09-12 NOTE — Consult Note (Addendum)
Pharmacy Antibiotic Note  Chad Cross is a 63 y.o. male admitted on 09/12/2019 with sepsis and UTI.  Pharmacy has been consulted for Vancomycin and Cefepime dosing.  Plan: 1) Patient received Vancomycin 1g IV x 1 dose in the ED, which was a sub-optimal loading dose. Will order Vancomycin 1750 mg IV Q 24 hrs to start 10/30@0800 . Goal AUC 400-550. Expected AUC: 465.6 Expected Css: 10.3 SCr used: 1.22  2) Cefepime 2g IV Q8 hours   Height: 5\' 10"  (177.8 cm) Weight: 205 lb (93 kg) IBW/kg (Calculated) : 73  Temp (24hrs), Avg:99.7 F (37.6 C), Min:98.3 F (36.8 C), Max:101.1 F (38.4 C)  Recent Labs  Lab 09/12/19 1727  WBC 9.4  CREATININE 1.22  LATICACIDVEN 1.1    Estimated Creatinine Clearance: 69.2 mL/min (by C-G formula based on SCr of 1.22 mg/dL).    Allergies  Allergen Reactions  . Lisinopril Cough    Antimicrobials this admission: Cefepime 10/29 >>  Vancomycin 10/29 >>    Microbiology results: 10/29 BCx: pending 10/29 UCx: pending    Thank you for allowing pharmacy to be a part of this patient's care.  Colbie Danner A Jaeshaun Riva 09/12/2019 10:18 PM

## 2019-09-12 NOTE — ED Notes (Signed)
Pt provided with warm blanket, pillow per pt's request.

## 2019-09-12 NOTE — ED Provider Notes (Signed)
[facility] Emergency Department Provider Note  Time seen: 6:24 PM  I have reviewed the triage vital signs and the nursing notes.   HISTORY  Chief Complaint Post-op Problem   HPI Chad Cross is a 63 y.o. male with a past medical history of anxiety, hypertension, hyperlipidemia, presents to the emergency department for a fever.  According to the patient 2 days ago 09/10/2019 he had prostate radiation seeding implanted by Dr. Hollice Espy.  States tonight he spiked a fever to 101, called his doctor who referred him to the emergency department.  Denies any rectal or abdominal pain.  States he had a normal bowel movement today.  Has noted some slight dysuria today but no odor or discoloration, etc.  Patient denies any cough or shortness of breath.   Past Medical History:  Diagnosis Date  . Anxiety 02/03/2016  . Apnea, sleep 11/15/2007   Overview:  Has CPAP but is unable to tolerate the mask due to anxiety.   . Cytopenia 02/03/2016  . Elevated prostate specific antigen (PSA) 11/11/2015  . HLD (hyperlipidemia) 02/03/2016  . Hypertension   . Prostate cancer ([place])   . Subclinical hypothyroidism 11/11/2015    Patient Active Problem List   Diagnosis Date Noted  . Anxiety 02/03/2016  . Cytopenia 02/03/2016  . HLD (hyperlipidemia) 02/03/2016  . Elevated prostate specific antigen (PSA) 11/11/2015  . Subclinical hypothyroidism 11/11/2015  . Apnea, sleep 11/15/2007    Past Surgical History:  Procedure Laterality Date  . none      Prior to Admission medications   Medication Sig Start Date End Date Taking? Authorizing Provider  levothyroxine (SYNTHROID, LEVOTHROID) 25 MCG tablet Take 25 mcg by mouth daily. 12/28/15   [provider]  losartan (COZAAR) 25 MG tablet Take 25 mg by mouth daily.    [provider]  simvastatin (ZOCOR) 10 MG tablet TAKE 1 TABLET (10 MG TOTAL) BY MOUTH NIGHTLY. 12/28/15   [provider]    Allergies  Allergen  Reactions  . Lisinopril Cough    Family History  Problem Relation Age of Onset  . Prostate cancer Brother   . Bladder Cancer Neg Hx   . Kidney cancer Neg Hx     Social History Social History   Tobacco Use  . Smoking status: Current Every Day Smoker    Packs/day: 1.00    Types: Cigarettes  . Smokeless tobacco: Never Used  Substance Use Topics  . Alcohol use: Yes    Alcohol/week: 0.0 standard drinks  . Drug use: No    Review of Systems Constitutional: Positive for fever Cardiovascular: Negative for chest pain. Respiratory: Negative for shortness of breath.  Negative for cough Gastrointestinal: Negative for abdominal pain.  Normal bowel movements without discomfort. Genitourinary: Slight dysuria today. Musculoskeletal: Negative for musculoskeletal complaints Neurological: Negative for headache All other ROS negative  ____________________________________________   PHYSICAL EXAM:  VITAL SIGNS: ED Triage Vitals  Enc Vitals Group     BP 09/12/19 1718 (!) 165/94     Pulse Rate 09/12/19 1718 (!) 134     Resp 09/12/19 1718 16     Temp 09/12/19 1718 (!) 101.1 F (38.4 C)     Temp Source 09/12/19 1718 Oral     SpO2 09/12/19 1718 96 %     Weight --      Height --      Head Circumference --      Peak Flow --      Pain Score 09/12/19 1719 10  Pain Loc --      Pain Edu? --      Excl. in [place]? --    Constitutional: Alert and oriented. Well appearing and in no distress. Eyes: Normal exam ENT      Head: Normocephalic and atraumatic.      Mouth/Throat: Mucous membranes are moist. Cardiovascular: Regular rhythm, rate around 120 bpm.  No obvious murmur. Respiratory: Normal respiratory effort without tachypnea nor retractions. Breath sounds are clear Gastrointestinal: Soft and nontender. No distention.  Musculoskeletal: Nontender with normal range of motion in all extremities.  Neurologic:  Normal speech and language. No gross focal neurologic deficits Skin:  Skin is  warm, dry and intact.  Psychiatric: Mood and affect are normal.   ____________________________________________   RADIOLOGY  Chest x-ray negative  ____________________________________________   INITIAL IMPRESSION / ASSESSMENT AND PLAN / ED COURSE  Pertinent labs & imaging results that were available during my care of the patient were reviewed by me and considered in my medical decision making (see chart for details).   Patient presents to the emergency department for fever 2 days after prostate radiation seed implants.  Differential this time would include urinary tract infection, prostatitis, intra-abdominal pathology.  Reassuringly patient has a benign abdominal exam.  Patient is tachycardic and febrile meeting sepsis criteria we will start broad-spectrum antibiotics.  Reassuringly the remainder of the patient's lab work has resulted largely within normal limits including a normal lactate and a normal white blood cell count.  Urinalysis is pending.  Urinalysis has resulted showing a large hemoglobin with large leukocytes, 21-50 WBCs and rare bacteria.  Possibly the source of the patient's infection.  We will continue to cover broad-spectrum antibiotics and admit to the hospital service for further work-up and treatment wise urine culture and blood cultures are pending.  Patient agreeable to plan of care.  Chad Cross was evaluated in Emergency Department on 09/12/2019 for the symptoms described in the history of present illness. He was evaluated in the context of the global COVID-19 pandemic, which necessitated consideration that the patient might be at risk for infection with the SARS-CoV-2 virus that causes COVID-19. Institutional protocols and algorithms that pertain to the evaluation of patients at risk for COVID-19 are in a state of rapid change based on information released by regulatory bodies including the CDC and federal and state organizations. These policies and algorithms were  followed during the patient's care in the ED.  ____________________________________________   FINAL CLINICAL IMPRESSION(S) / ED DIAGNOSES  Fever Urinary tract infection   Harvest Dark, MD 09/12/19 2049

## 2019-09-12 NOTE — ED Notes (Signed)
This RN contacted pharmacy regarding cefepime and falgyl due to out of stock. Pharmacy st they will send it to room 1.

## 2019-09-12 NOTE — ED Notes (Signed)
ED TO INPATIENT HANDOFF REPORT  ED Nurse Name and Phone #: [phone]  S Name/Age/Gender Chad Cross 63 y.o. male Room/Bed: ED01A/ED01A  Code Status   Code Status: Not on file  Home/SNF/Other Home A/ox4 Is this baseline? Yes   Triage Complete: Triage complete  Chief Complaint fever off and on    Triage Note Pt had prostate procedure " seeds" for radiation on 10/27 and states he has been having intermit fevers and was told to come to ED. PT 101.1 currently. PT HR 130s    Allergies Allergies  Allergen Reactions  . Lisinopril Cough    Level of Care/Admitting Diagnosis ED Disposition    ED Disposition Condition Comment   Admit  The patient appears reasonably stabilized for admission considering the current resources, flow, and capabilities available in the ED at this time, and I doubt any other [facility] requiring further screening and/or treatment in the ED prior to admission is  present.       B Medical/Surgery History Past Medical History:  Diagnosis Date  . Anxiety 02/03/2016  . Apnea, sleep 11/15/2007   Overview:  Has CPAP but is unable to tolerate the mask due to anxiety.   . Cytopenia 02/03/2016  . Elevated prostate specific antigen (PSA) 11/11/2015  . HLD (hyperlipidemia) 02/03/2016  . Hypertension   . Prostate cancer ([place])   . Subclinical hypothyroidism 11/11/2015   Past Surgical History:  Procedure Laterality Date  . none       A IV Location/Drains/Wounds Patient Lines/Drains/Airways Status   Active Line/Drains/Airways    Name:   Placement date:   Placement time:   Site:   Days:   Peripheral IV 09/12/19 Right Antecubital   09/12/19    1759    Antecubital   less than 1   Peripheral IV 09/12/19 Left Forearm   09/12/19    1911    Forearm   less than 1          Intake/Output Last 24 hours No intake or output data in the 24 hours ending 09/12/19 2054  Labs/Imaging Results for orders placed or performed during the hospital encounter of 09/12/19 (from the  past 48 hour(s))  Comprehensive metabolic panel     Status: Abnormal   Collection Time: 09/12/19  5:27 PM  Result Value Ref Range   Sodium 134 (L) 135 - 145 mmol/L   Potassium 3.8 3.5 - 5.1 mmol/L   Chloride 101 98 - 111 mmol/L   CO2 23 22 - 32 mmol/L   Glucose, Bld 126 (H) 70 - 99 mg/dL   BUN 13 8 - 23 mg/dL   Creatinine, Ser 1.22 0.61 - 1.24 mg/dL   Calcium 9.5 8.9 - 10.3 mg/dL   Total Protein 7.7 6.5 - 8.1 g/dL   Albumin 4.3 3.5 - 5.0 g/dL   AST 20 15 - 41 U/L   ALT 18 0 - 44 U/L   Alkaline Phosphatase 76 38 - 126 U/L   Total Bilirubin 1.3 (H) 0.3 - 1.2 mg/dL   GFR calc non Af Amer >60 >60 mL/min   GFR calc Af Amer >60 >60 mL/min   Anion gap 10 5 - 15    Comment: Performed at [facility], [address] [place], [place] [address]  Lactic acid, plasma     Status: None   Collection Time: 09/12/19  5:27 PM  Result Value Ref Range   Lactic Acid, Venous 1.1 0.5 - 1.9 mmol/L    Comment: Performed at [facility], [place], [place] [address]  CBC with Differential     Status: Abnormal   Collection Time: 09/12/19  5:27 PM  Result Value Ref Range   WBC 9.4 4.0 - 10.5 K/uL   RBC 4.62 4.22 - 5.81 MIL/uL   Hemoglobin 14.5 13.0 - 17.0 g/dL   HCT 42.1 39.0 - 52.0 %   MCV 91.1 80.0 - 100.0 fL   MCH 31.4 26.0 - 34.0 pg   MCHC 34.4 30.0 - 36.0 g/dL   RDW 12.0 11.5 - 15.5 %   Platelets 125 (L) 150 - 400 K/uL   nRBC 0.0 0.0 - 0.2 %   Neutrophils Relative % 82 %   Neutro Abs 7.6 1.7 - 7.7 K/uL   Lymphocytes Relative 9 %   Lymphs Abs 0.9 0.7 - 4.0 K/uL   Monocytes Relative 9 %   Monocytes Absolute 0.8 0.1 - 1.0 K/uL   Eosinophils Relative 0 %   Eosinophils Absolute 0.0 0.0 - 0.5 K/uL   Basophils Relative 0 %   Basophils Absolute 0.0 0.0 - 0.1 K/uL   Immature Granulocytes 0 %   Abs Immature Granulocytes 0.04 0.00 - 0.07 K/uL    Comment: Performed at [facility], [address] [place], [place] [address]  Protime-INR     Status: None    Collection Time: 09/12/19  5:27 PM  Result Value Ref Range   Prothrombin Time 15.2 11.4 - 15.2 seconds   INR 1.2 0.8 - 1.2    Comment: (NOTE) INR goal varies based on device and disease states. Performed at [facility], [address] [place], [place] [address]   Urinalysis, Routine w reflex microscopic     Status: Abnormal   Collection Time: 09/12/19  6:44 PM  Result Value Ref Range   Color, Urine YELLOW (A) YELLOW   APPearance CLOUDY (A) CLEAR   Specific Gravity, Urine 1.001 (L) 1.005 - 1.030   pH 6.0 5.0 - 8.0   Glucose, UA NEGATIVE NEGATIVE mg/dL   Hgb urine dipstick LARGE (A) NEGATIVE   Bilirubin Urine NEGATIVE NEGATIVE   Ketones, ur NEGATIVE NEGATIVE mg/dL   Protein, ur NEGATIVE NEGATIVE mg/dL   Nitrite NEGATIVE NEGATIVE   Leukocytes,Ua LARGE (A) NEGATIVE   RBC / HPF 0-5 0 - 5 RBC/hpf   WBC, UA 21-50 0 - 5 WBC/hpf   Bacteria, UA RARE (A) NONE SEEN   Squamous Epithelial / LPF 0-5 0 - 5    Comment: Performed at [facility], [address] [place], [place] [address]   Dg Chest 2 View  Result Date: 09/12/2019 CLINICAL DATA:  Intermittent fever EXAM: CHEST - 2 VIEW COMPARISON:  None. FINDINGS: The heart size and mediastinal contours are within normal limits. Both lungs are clear. The visualized skeletal structures are unremarkable. IMPRESSION: No active cardiopulmonary disease. Electronically Signed   By: Donavan Foil M.D.   On: 09/12/2019 18:01    Pending Labs Unresulted Labs (From admission, onward)    Start     Ordered   09/12/19 1834  SARS CORONAVIRUS 2 (TAT 6-24 HRS) Nasopharyngeal Nasopharyngeal Swab  (Asymptomatic/Tier 2 Patients Labs)  Once,   STAT    Question Answer Comment  Is this test for diagnosis or screening Screening   Symptomatic for COVID-19 as defined by CDC No   Hospitalized for COVID-19 No   Admitted to ICU for COVID-19 No   Previously tested for COVID-19 No   Resident in a congregate (group) care setting  No   Employed in  healthcare setting No      09/12/19 1833   09/12/19 1817  Urine culture  ONCE - STAT,   STAT     09/12/19 1817   09/12/19 1721  Lactic acid, plasma  Now then every 2 hours,   STAT     09/12/19 1720   09/12/19 1721  Culture, blood (Routine x 2)  BLOOD CULTURE X 2,   STAT     09/12/19 1720          Vitals/Pain Today's Vitals   09/12/19 1900 09/12/19 1930 09/12/19 1944 09/12/19 2030  BP: 140/81 138/74  (!) 142/76  Pulse: (!) 112 (!) 108  98  Resp:  20  (!) 21  Temp:      TempSrc:      SpO2: 100% 99%  100%  Weight:   93 kg   Height:   5\' 10"  (1.778 m)   PainSc:  0-No pain      Isolation Precautions No active isolations  Medications Medications  metroNIDAZOLE (FLAGYL) IVPB 500 mg (500 mg Intravenous New Bag/Given 09/12/19 2014)  lactated ringers bolus 1,000 mL (0 mLs Intravenous Stopped 09/12/19 2005)  ceFEPIme (MAXIPIME) 2 g in sodium chloride 0.9 % 100 mL IVPB (0 g Intravenous Stopped 09/12/19 1944)  vancomycin (VANCOCIN) IVPB 1000 mg/200 mL premix (0 mg Intravenous Stopped 09/12/19 2014)    Mobility walks Low fall risk     R Recommendations: See Admitting Provider Note  Report given to:   Additional Notes: pt ambulatory with a steady gait.

## 2019-09-13 LAB — BASIC METABOLIC PANEL
Anion gap: 12 (ref 5–15)
BUN: 13 mg/dL (ref 8–23)
CO2: 19 mmol/L — ABNORMAL LOW (ref 22–32)
Calcium: 9.1 mg/dL (ref 8.9–10.3)
Chloride: 104 mmol/L (ref 98–111)
Creatinine, Ser: 1.13 mg/dL (ref 0.61–1.24)
GFR calc Af Amer: >60 mL/min (ref >60)
GFR calc non Af Amer: >60 mL/min (ref >60)
Glucose, Bld: 112 mg/dL — ABNORMAL HIGH (ref 70–99)
Potassium: 3.7 mmol/L (ref 3.5–5.1)
Sodium: 135 mmol/L (ref 135–145)

## 2019-09-13 LAB — CBC
HCT: 40.3 % (ref 39.0–52.0)
Hemoglobin: 13.4 g/dL (ref 13.0–17.0)
MCH: 31.3 pg (ref 26.0–34.0)
MCHC: 33.3 g/dL (ref 30.0–36.0)
MCV: 94.2 fL (ref 80.0–100.0)
Platelets: 110 10*3/uL — ABNORMAL LOW (ref 150–400)
RBC: 4.28 MIL/uL (ref 4.22–5.81)
RDW: 12 % (ref 11.5–15.5)
WBC: 7.9 10*3/uL (ref 4.0–10.5)
nRBC: 0 % (ref 0.0–0.2)

## 2019-09-13 LAB — SARS CORONAVIRUS 2 (TAT 6-24 HRS): SARS Coronavirus 2: NEGATIVE

## 2019-09-13 LAB — HIV ANTIBODY (ROUTINE TESTING W REFLEX): HIV Screen 4th Generation wRfx: NONREACTIVE

## 2019-09-13 MED ORDER — PNEUMOCOCCAL VAC POLYVALENT 25 MCG/0.5ML IJ INJ
0.5000 mL | INJECTION | INTRAMUSCULAR | Status: DC
  Filled 2019-09-13: qty 0.5

## 2019-09-13 MED ORDER — LOSARTAN POTASSIUM 25 MG PO TABS
25.0000 mg | ORAL_TABLET | Freq: Every day | ORAL | Status: DC
  Administered 2019-09-13 – 2019-09-15 (×3): 25 mg via ORAL
  Filled 2019-09-13 (×3): qty 1

## 2019-09-13 MED ORDER — ONDANSETRON HCL 4 MG PO TABS: 4.0000 mg | ORAL_TABLET | Freq: Four times a day (QID) | ORAL | Status: DC | PRN

## 2019-09-13 MED ORDER — ACETAMINOPHEN 325 MG PO TABS
650.0000 mg | ORAL_TABLET | Freq: Four times a day (QID) | ORAL | Status: DC | PRN
  Administered 2019-09-13 (×2): 650 mg via ORAL
  Filled 2019-09-13 (×2): qty 2

## 2019-09-13 MED ORDER — SIMVASTATIN 20 MG PO TABS
10.0000 mg | ORAL_TABLET | Freq: Every day | ORAL | Status: DC
  Administered 2019-09-13 – 2019-09-14 (×2): 10 mg via ORAL
  Filled 2019-09-13 (×2): qty 1

## 2019-09-13 MED ORDER — SODIUM CHLORIDE 0.9 % IV SOLN
INTRAVENOUS | Status: AC
  Administered 2019-09-13 – 2019-09-14 (×3): via INTRAVENOUS

## 2019-09-13 MED ORDER — METOPROLOL TARTRATE 25 MG PO TABS
25.0000 mg | ORAL_TABLET | Freq: Two times a day (BID) | ORAL | Status: DC
  Administered 2019-09-13 – 2019-09-15 (×4): 25 mg via ORAL
  Filled 2019-09-13 (×4): qty 1

## 2019-09-13 MED ORDER — ONDANSETRON HCL 4 MG/2ML IJ SOLN: 4.0000 mg | Freq: Four times a day (QID) | INTRAMUSCULAR | Status: DC | PRN

## 2019-09-13 MED ORDER — ENOXAPARIN SODIUM 40 MG/0.4ML ~~LOC~~ SOLN
40.0000 mg | Freq: Every day | SUBCUTANEOUS | Status: DC
  Administered 2019-09-13 – 2019-09-14 (×3): 40 mg via SUBCUTANEOUS
  Filled 2019-09-13 (×4): qty 0.4

## 2019-09-13 MED ORDER — LEVOTHYROXINE SODIUM 25 MCG PO TABS
25.0000 ug | ORAL_TABLET | Freq: Every day | ORAL | Status: DC
  Administered 2019-09-13 – 2019-09-15 (×3): 25 ug via ORAL
  Filled 2019-09-13 (×3): qty 1

## 2019-09-13 MED ORDER — ACETAMINOPHEN 650 MG RE SUPP: 650.0000 mg | Freq: Four times a day (QID) | RECTAL | Status: DC | PRN

## 2019-09-13 MED ORDER — VANCOMYCIN HCL IN DEXTROSE 1-5 GM/200ML-% IV SOLN
1000.0000 mg | Freq: Two times a day (BID) | INTRAVENOUS | Status: DC
  Filled 2019-09-13: qty 200

## 2019-09-13 NOTE — ED Notes (Signed)
Pt resting.

## 2019-09-13 NOTE — ED Notes (Signed)
Report received from Yessica RN. Patient care assumed. Patient/RN introduction complete. Will continue to monitor.  

## 2019-09-13 NOTE — Progress Notes (Signed)
Notify Dr. Margaretmary Eddy about patient's having a low grade temperature 100.6, then recheck 100.2 was also tachycardic HR 105-110's asked if patient need IVF,order given. RN will continue to monitor.

## 2019-09-13 NOTE — ED Notes (Addendum)
This [place]. Awaiting on medication from pahrmacy at this time.

## 2019-09-13 NOTE — ED Notes (Signed)
Pt assisted to toilet to void 

## 2019-09-13 NOTE — Consult Note (Signed)
Pharmacy Antibiotic Note  Chad Cross is a 63 y.o. male admitted on 09/12/2019 with sepsis and UTI.  Pharmacy has been consulted for Vancomycin and Cefepime dosing.  Plan: 1) Patient received Vancomycin 1g IV x 1 dose in the ED.  Vancomycin 1750 mg IV Q 24 hrs ordered to start 10/30 @0800  now with improvement in renal function. SCr 1.22>1.13. Expect trough to be <10 on current dose.   Will adjust vancomycin dose to 1000 mg IV q12h for Goal AUC 400-550. Expected AUC: 496 Expected Css: 15.0 SCr used: 1.13   2) Cefepime 2g IV Q8 hours  Will order SCr for AM to continue to monitor renal function.    Height: 5\' 10"  (177.8 cm) Weight: 205 lb (93 kg) IBW/kg (Calculated) : 73  Temp (24hrs), Avg:99.2 F (37.3 C), Min:98.3 F (36.8 C), Max:101.1 F (38.4 C)  Recent Labs  Lab 09/12/19 1727 09/13/19 0534  WBC 9.4 7.9  CREATININE 1.22 1.13  LATICACIDVEN 1.1  --     Estimated Creatinine Clearance: 74.7 mL/min (by C-G formula based on SCr of 1.13 mg/dL).    Allergies  Allergen Reactions  . Lisinopril Cough    Antimicrobials this admission: Cefepime 10/29 >>  Vancomycin 10/29 >>    Microbiology results: 10/29 BCx: NGTD 10/29 UCx: pending    Thank you for allowing pharmacy to be a part of this patient's care.  Rocky Morel 09/13/2019 1:14 PM

## 2019-09-13 NOTE — Progress Notes (Signed)
[name] NAME: Chad Cross    MR#:  [id number]  DATE OF BIRTH:  11-19-1955  SUBJECTIVE:  CHIEF COMPLAINT: Patient is febrile but denies any nausea or vomiting has chronic back pain  REVIEW OF SYSTEMS:  CONSTITUTIONAL: Has fever,  weakness.  EYES: No blurred or double vision.  EARS, NOSE, AND THROAT: No tinnitus or ear pain.  RESPIRATORY: No cough, shortness of breath, wheezing or hemoptysis.  CARDIOVASCULAR: No chest pain, orthopnea, edema.  GASTROINTESTINAL: No nausea, vomiting, diarrhea or abdominal pain.  GENITOURINARY: No dysuria, hematuria.  ENDOCRINE: No polyuria, nocturia,  HEMATOLOGY: No anemia, easy bruising or bleeding SKIN: No rash or lesion. MUSCULOSKELETAL: No joint pain or arthritis.   NEUROLOGIC: No tingling, numbness, weakness.  PSYCHIATRY: No anxiety or depression.   DRUG ALLERGIES:   Allergies  Allergen Reactions  . Lisinopril Cough    VITALS:  Blood pressure 124/75, pulse 97, temperature 98.6 F (37 C), temperature source Oral, resp. rate 20, height _0  (1.778 m), weight 93 kg, SpO2 99 %.  PHYSICAL EXAMINATION:  GENERAL:  63 y.o.-year-old patient lying in the bed with no acute distress.  EYES: Pupils equal, round, reactive to light and accommodation. No scleral icterus. Extraocular muscles intact.  HEENT: Head atraumatic, normocephalic. Oropharynx and nasopharynx clear.  NECK:  Supple, no jugular venous distention. No thyroid enlargement, no tenderness.  LUNGS: Normal breath sounds bilaterally, no wheezing, rales,rhonchi or crepitation. No use of accessory muscles of respiration.  CARDIOVASCULAR: S1, S2 normal. No murmurs, rubs, or gallops.  ABDOMEN: Soft, nontender, nondistended. Bowel sounds present. EXTREMITIES: No pedal edema, cyanosis, or clubbing.  NEUROLOGIC: Cranial nerves II through XII are intact. Muscle strength gen weakness  in all extremities. Sensation intact. Gait not  checked.  PSYCHIATRIC: The patient is alert and oriented x 3.  SKIN: No obvious rash, lesion, or ulcer.    LABORATORY PANEL:   CBC Recent Labs  Lab 09/13/19 0534  WBC 7.9  HGB 13.4  HCT 40.3  PLT 110*   ------------------------------------------------------------------------------------------------------------------  Chemistries  Recent Labs  Lab 09/12/19 1727 09/13/19 0534  NA 134* 135  K 3.8 3.7  CL 101 104  CO2 23 19*  GLUCOSE 126* 112*  BUN 13 13  CREATININE 1.22 1.13  CALCIUM 9.5 9.1  AST 20  --   ALT 18  --   ALKPHOS 76  --   BILITOT 1.3*  --    ------------------------------------------------------------------------------------------------------------------  Cardiac Enzymes No results for input(s): TROPONINI in the last 168 hours. ------------------------------------------------------------------------------------------------------------------  RADIOLOGY:  Dg Chest 2 View  Result Date: 09/12/2019 CLINICAL DATA:  Intermittent fever EXAM: CHEST - 2 VIEW COMPARISON:  None. FINDINGS: The heart size and mediastinal contours are within normal limits. Both lungs are clear. The visualized skeletal structures are unremarkable. IMPRESSION: No active cardiopulmonary disease. Electronically Signed   By: Donavan Foil M.D.   On: 09/12/2019 18:01    EKG:   Orders placed or performed during the hospital encounter of 09/12/19  . ED EKG 12-Lead  . ED EKG 12-Lead    ASSESSMENT AND PLAN:     Sepsis ([place]) -sepsis is due to UTI after instrumentation for prostate seeding.   Patient met septic criteria with fever and tachycardia at the time of admission lactic acid is within normal limits.  No leukocytosis   Blood pressure stable, even high.   Blood and urine cultures were sent.   IV antibiotics and IV fluids    UTI (  urinary tract infection) -IV antibiotics and culture as above.  Provide IV fluids and supportive treatment    HTN (hypertension) - elevated, continue  home dose antihypertensives Cozaar plus additional as needed antihypertensives for blood pressure goal less than 160/100 Metoprolol is added to the regimen    Prostate cancer [facility]) -recently underwent instrumentation for seeding as treatment per urology.  Treatment of UTI as above    HLD (hyperlipidemia) -antilipid Zocor his home medication    OSA (obstructive sleep apnea) -CPAP nightly    Hypothyroidism -home dose Synthyroid     All the records are reviewed and case discussed with Care Management/Social Workerr. Management plans discussed with the patient, he is  in agreement.  CODE STATUS: fc   TOTAL TIME TAKING CARE OF THIS PATIENT: 35  minutes.   POSSIBLE D/C IN 2 DAYS, DEPENDING ON CLINICAL CONDITION.  Note: This dictation was prepared with Dragon dictation along with smaller phrase technology. Any transcriptional errors that result from this process are unintentional.   Nicholes Mango M.D on 09/13/2019 at 1:59 PM  Between 7am to 6pm - Pager - [phone] After 6pm go to [email or web address] - password [email or web address] [place] [facility]  Office  [phone]  CC: Primary care physician; [name], NP

## 2019-09-13 NOTE — H&P (Signed)
Pt had order from MD to have CPAP at night for sleep apnea. Pt declined to wear stated he cannot wear anything on face. I explained the importance of CPAP and pt still refused to wear.

## 2019-09-13 NOTE — ED Notes (Signed)
Pt resting quietly with eyes closed, has no complaints of pain at this time. Pt awaiting bed availability, pt aware and positioned for comfort.

## 2019-09-13 NOTE — Plan of Care (Signed)
  Problem: Education: Goal: Knowledge of General Education information will improve Description: Including pain rating scale, medication(s)/side effects and non-pharmacologic comfort measures Outcome: Progressing Problem: Education: Goal: Knowledge of disease or condition will improve Outcome: Progressing Reviewed POC with pt and he had no further questions.  Will continue to monitor and assess.    Problem: Activity: Goal: Ability to tolerate increased activity will improve Outcome: Progressing Pt is able to ambulate freely in his room.  He was observed to have a steady gait.  Will continue to monitor and assess.    Problem: Respiratory: Goal: Ability to maintain a clear airway will improve Outcome: Progressing Respiratory status monitored and assessed q-shift/ PRN, along with VS.  Pt is on room air with O2 saturations at 99% and respiration rate of 20 breaths per minute.  Pt has not endorsed SOB or DOE.  Will continue to monitor and assess.   Problem: Activity: Goal: Risk for activity intolerance will decrease Outcome: Progressing Pt is able to ambulate to the BR without the assistance of RN staff.    Problem: Pain Managment: Goal: General experience of comfort will improve Outcome: Progressing Pt has denied pain thus far.  Will continue to monitor and assess.    Problem: Safety: Goal: Ability to remain free from injury will improve Outcome: Progressing Instructed pt to utilize RN call light for assistance.  Hourly rounds performed.  Bed alarm implemented to keep pt safe from falls.  Settings set to third most sensitive settings. Bed in lowest position, locked with two upper side rails engaged.  Belongings and call light within reach.    Problem: Skin Integrity: Goal: Risk for impaired skin integrity will decrease Outcome: Progressing Skin integrity monitored and assessed q-shift/ PRN.  Instructed pt to turn q2 hours to prevent further skin impairment.  Tubes and drains assessed  for device related pressure sores.  Dressing changes performed per MD's orders.  Will continue to monitor and assess.    Problem: Clinical Measurements: Goal: Will remain free from infection Outcome: Progressing S/Sx of infection monitored and assessed q-shift/ PRN, along with VS.  Pt has been afebrile thus far.  He is on IV abx per MD's orders.  Will continue to monitor and assess.

## 2019-09-13 NOTE — Progress Notes (Signed)
Pt admitted from the ED from suspected sepsis d/t a UTI.  Oriented pt to his room.  Reviewed POC and asked any questions that pt had.  Obtained his VS.  Instructed pt to utilize RN call light and RN direct phone number for assistance.  Pt is denying pain and is resting comfortably in his room.  Will continue to monitor and assess.

## 2019-09-13 NOTE — ED Notes (Signed)
Pt resting comfortably, no change in condition.  

## 2019-09-14 LAB — BLOOD CULTURE ID PANEL (REFLEXED)

## 2019-09-14 LAB — URINE CULTURE: Culture: 10000 — AB

## 2019-09-14 LAB — CREATININE, SERUM
Creatinine, Ser: 1.15 mg/dL (ref 0.61–1.24)
GFR calc Af Amer: >60 mL/min (ref >60)
GFR calc non Af Amer: >60 mL/min (ref >60)

## 2019-09-14 MED ORDER — SODIUM CHLORIDE 0.9 % IV SOLN
1.0000 g | Freq: Three times a day (TID) | INTRAVENOUS | Status: DC
  Administered 2019-09-14 – 2019-09-15 (×4): 1 g via INTRAVENOUS
  Filled 2019-09-14 (×6): qty 1

## 2019-09-14 NOTE — Progress Notes (Signed)
PHARMACY - PHYSICIAN COMMUNICATION CRITICAL VALUE ALERT - BLOOD CULTURE IDENTIFICATION (BCID)  Chad Cross is an 63 y.o. male who presented to [facility] on 09/12/2019 admitted with sepsis and UTI.  Assessment:  Lab reports 1 of 4 bottles, aerobic, GNR, E Coli KPC not detected  Name of physician (or Provider) Contacted: Dr Marcille Blanco  Current antibiotics: Vancomycin and Cefepime  Changes to prescribed antibiotics recommended: Meropenem 1gm IV q8h Recommendations accepted by provider  No results found for this or any previous visit.  [name] A 09/14/2019  4:45 AM

## 2019-09-14 NOTE — Progress Notes (Signed)
[facility] NAME: Chad Cross    MR#:  [id number]  DATE OF BIRTH:  04-Nov-1956  SUBJECTIVE:  CHIEF COMPLAINT: Patient is febrile until yesterday evening but denies any nausea or vomiting .  Feels fine otherwise  REVIEW OF SYSTEMS:  CONSTITUTIONAL: Has fever,  weakness.  EYES: No blurred or double vision.  EARS, NOSE, AND THROAT: No tinnitus or ear pain.  RESPIRATORY: No cough, shortness of breath, wheezing or hemoptysis.  CARDIOVASCULAR: No chest pain, orthopnea, edema.  GASTROINTESTINAL: No nausea, vomiting, diarrhea or abdominal pain.  GENITOURINARY: No dysuria, hematuria.  ENDOCRINE: No polyuria, nocturia,  HEMATOLOGY: No anemia, easy bruising or bleeding SKIN: No rash or lesion. MUSCULOSKELETAL: No joint pain or arthritis.   NEUROLOGIC: No tingling, numbness, weakness.  PSYCHIATRY: No anxiety or depression.   DRUG ALLERGIES:   Allergies  Allergen Reactions  . Lisinopril Cough    VITALS:  Blood pressure 123/61, pulse 83, temperature 98 F (36.7 C), resp. rate 18, height 5' 10"  (1.778 m), weight 93 kg, SpO2 99 %.  PHYSICAL EXAMINATION:  GENERAL:  63 y.o.-year-old patient lying in the bed with no acute distress.  EYES: Pupils equal, round, reactive to light and accommodation. No scleral icterus. Extraocular muscles intact.  HEENT: Head atraumatic, normocephalic. Oropharynx and nasopharynx clear.  NECK:  Supple, no jugular venous distention. No thyroid enlargement, no tenderness.  LUNGS: Normal breath sounds bilaterally, no wheezing, rales,rhonchi or crepitation. No use of accessory muscles of respiration.  CARDIOVASCULAR: S1, S2 normal. No murmurs, rubs, or gallops.  ABDOMEN: Soft, nontender, nondistended. Bowel sounds present. EXTREMITIES: No pedal edema, cyanosis, or clubbing.  NEUROLOGIC: Cranial nerves II through XII are intact. Muscle strength gen weakness  in all extremities. Sensation intact. Gait not  checked.  PSYCHIATRIC: The patient is alert and oriented x 3.  SKIN: No obvious rash, lesion, or ulcer.    LABORATORY PANEL:   CBC Recent Labs  Lab 09/13/19 0534  WBC 7.9  HGB 13.4  HCT 40.3  PLT 110*   ------------------------------------------------------------------------------------------------------------------  Chemistries  Recent Labs  Lab 09/12/19 1727 09/13/19 0534 09/14/19 0648  NA 134* 135  --   K 3.8 3.7  --   CL 101 104  --   CO2 23 19*  --   GLUCOSE 126* 112*  --   BUN 13 13  --   CREATININE 1.22 1.13 1.15  CALCIUM 9.5 9.1  --   AST 20  --   --   ALT 18  --   --   ALKPHOS 76  --   --   BILITOT 1.3*  --   --    ------------------------------------------------------------------------------------------------------------------  Cardiac Enzymes No results for input(s): TROPONINI in the last 168 hours. ------------------------------------------------------------------------------------------------------------------  RADIOLOGY:  Dg Chest 2 View  Result Date: 09/12/2019 CLINICAL DATA:  Intermittent fever EXAM: CHEST - 2 VIEW COMPARISON:  None. FINDINGS: The heart size and mediastinal contours are within normal limits. Both lungs are clear. The visualized skeletal structures are unremarkable. IMPRESSION: No active cardiopulmonary disease. Electronically Signed   By: Donavan Foil M.D.   On: 09/12/2019 18:01    EKG:   Orders placed or performed during the hospital encounter of 09/12/19  . ED EKG 12-Lead  . ED EKG 12-Lead    ASSESSMENT AND PLAN:     Sepsis ([place]) -sepsis is due to UTI after instrumentation for prostate seeding.   Patient met septic criteria with fever and tachycardia at the time  of admission lactic acid is within normal limits.  No leukocytosis   Blood pressure stable, even high.   Blood cultures with Enterobacter cloacae E. coli and urine cultures are pending Continue IV antibiotics meropenem and IV fluids Repeat a.m. labs    UTI  (urinary tract infection) -IV antibiotics and culture as above.  Provide IV fluids and supportive treatment    HTN (hypertension) -   continue home dose antihypertensives Cozaar plus additional as needed antihypertensives for blood pressure goal less than 160/100 Metoprolol is added to the regimen and blood pressure is significantly improved and in the normal range now continue close monitoring    Prostate cancer [facility]) -recently underwent instrumentation for seeding as treatment per urology.  Treatment of UTI as above    HLD (hyperlipidemia) -antilipid Zocor his home medication    OSA (obstructive sleep apnea) -CPAP nightly    Hypothyroidism -home dose Synthyroid     All the records are reviewed and case discussed with Care Management/Social Workerr. Management plans discussed with the patient, he is  in agreement.  CODE STATUS: fc   TOTAL TIME TAKING CARE OF THIS PATIENT: 35  minutes.   POSSIBLE D/C IN 1-2 DAYS, DEPENDING ON CLINICAL CONDITION.  Note: This dictation was prepared with Dragon dictation along with smaller phrase technology. Any transcriptional errors that result from this process are unintentional.   Nicholes Mango M.D on 09/14/2019 at 1:38 PM  Between 7am to 6pm - Pager - [phone] After 6pm go to [email or web address] - password [email or web address] [facility]  Office  [phone]  CC: Primary care physician; [name], NP

## 2019-09-14 NOTE — Consult Note (Signed)
Pharmacy Antibiotic Note  Chad Cross is a 63 y.o. male admitted on 09/12/2019 with sepsis and UTI.  Lab reported + blood cx w/ E Coli.  Pharmacy has been consulted for Meropenem dosing.  Plan: Meropenem 1gm IV q8h  Height: 5\' 10"  (177.8 cm) Weight: 205 lb (93 kg) IBW/kg (Calculated) : 73  Temp (24hrs), Avg:99.5 F (37.5 C), Min:98.3 F (36.8 C), Max:100.6 F (38.1 C)  Recent Labs  Lab 09/12/19 1727 09/13/19 0534  WBC 9.4 7.9  CREATININE 1.22 1.13  LATICACIDVEN 1.1  --     Estimated Creatinine Clearance: 74.7 mL/min (by C-G formula based on SCr of 1.13 mg/dL).    Allergies  Allergen Reactions  . Lisinopril Cough   Antimicrobials this admission: Cefepime 10/29 >> 10/31 Vancomycin 10/29 >> 10/31 Meropenem 10/31 >>  Microbiology results: 10/29 BCx: E Coli (lab reported on 10/31) 10/29 UCx: pending    Thank you for allowing pharmacy to be a part of this patient's care.  [name] A 09/14/2019 5:08 AM

## 2019-09-15 DIAGNOSIS — G4733 Obstructive sleep apnea (adult) (pediatric): Secondary | ICD-10-CM

## 2019-09-15 DIAGNOSIS — C61 Malignant neoplasm of prostate: Secondary | ICD-10-CM

## 2019-09-15 DIAGNOSIS — A4151 Sepsis due to Escherichia coli [E. coli]: Secondary | ICD-10-CM

## 2019-09-15 DIAGNOSIS — N3000 Acute cystitis without hematuria: Secondary | ICD-10-CM

## 2019-09-15 DIAGNOSIS — E039 Hypothyroidism, unspecified: Secondary | ICD-10-CM

## 2019-09-15 LAB — CBC WITH DIFFERENTIAL/PLATELET
Abs Immature Granulocytes: 0 10*3/uL (ref 0.00–0.07)
Basophils Absolute: 0 10*3/uL (ref 0.0–0.1)
Basophils Relative: 1 %
Eosinophils Absolute: 0.1 10*3/uL (ref 0.0–0.5)
Eosinophils Relative: 2 %
HCT: 38.6 % — ABNORMAL LOW (ref 39.0–52.0)
Hemoglobin: 12.8 g/dL — ABNORMAL LOW (ref 13.0–17.0)
Immature Granulocytes: 0 %
Lymphocytes Relative: 38 %
Lymphs Abs: 1.5 10*3/uL (ref 0.7–4.0)
MCH: 30.5 pg (ref 26.0–34.0)
MCHC: 33.2 g/dL (ref 30.0–36.0)
MCV: 91.9 fL (ref 80.0–100.0)
Monocytes Absolute: 0.6 10*3/uL (ref 0.1–1.0)
Monocytes Relative: 14 %
Neutro Abs: 1.8 10*3/uL (ref 1.7–7.7)
Neutrophils Relative %: 45 %
Platelets: 115 10*3/uL — ABNORMAL LOW (ref 150–400)
RBC: 4.2 MIL/uL — ABNORMAL LOW (ref 4.22–5.81)
RDW: 12 % (ref 11.5–15.5)
WBC: 4 10*3/uL (ref 4.0–10.5)
nRBC: 0 % (ref 0.0–0.2)

## 2019-09-15 LAB — BASIC METABOLIC PANEL
Anion gap: 6 (ref 5–15)
BUN: 14 mg/dL (ref 8–23)
CO2: 26 mmol/L (ref 22–32)
Calcium: 9 mg/dL (ref 8.9–10.3)
Chloride: 107 mmol/L (ref 98–111)
Creatinine, Ser: 1.03 mg/dL (ref 0.61–1.24)
GFR calc Af Amer: >60 mL/min (ref >60)
GFR calc non Af Amer: >60 mL/min (ref >60)
Glucose, Bld: 105 mg/dL — ABNORMAL HIGH (ref 70–99)
Potassium: 4.1 mmol/L (ref 3.5–5.1)
Sodium: 139 mmol/L (ref 135–145)

## 2019-09-15 MED ORDER — CEPHALEXIN 500 MG PO CAPS: 500.0000 mg | ORAL_CAPSULE | Freq: Four times a day (QID) | ORAL | 0 refills | Status: AC

## 2019-09-15 MED ORDER — HYDRALAZINE HCL 20 MG/ML IJ SOLN: 10.0000 mg | INTRAMUSCULAR | Status: DC | PRN

## 2019-09-15 MED ORDER — SENNOSIDES-DOCUSATE SODIUM 8.6-50 MG PO TABS: 2.0000 | ORAL_TABLET | Freq: Every evening | ORAL | Status: DC | PRN

## 2019-09-15 MED ORDER — METOPROLOL TARTRATE 25 MG PO TABS: 25.0000 mg | ORAL_TABLET | Freq: Two times a day (BID) | ORAL | 0 refills | Status: DC

## 2019-09-15 MED ORDER — POLYETHYLENE GLYCOL 3350 17 G PO PACK: 17.0000 g | PACK | Freq: Every day | ORAL | Status: DC | PRN

## 2019-09-15 NOTE — Progress Notes (Signed)
Pt d/c to home via wife. IVs removed intact. VSS. Education completed. All questions answered. All belongings sent with pt. Pt refused wheelchair.

## 2019-09-15 NOTE — Discharge Summary (Signed)
Physician Discharge Summary  Chad Cross [id number] DOB: [birth year] DOA: 09/12/2019  PCP: [name], NP  Admit date: 09/12/2019 Discharge date: 09/15/2019  Admitted From: Home Disposition: Home  Recommendations for Outpatient Follow-up:  1. Follow up with PCP in 1-2 weeks 2. Please obtain BMP/CBC in one week your next doctors visit.  3. Oral Keflex for 8 days 4. Advised oral hydration   Discharge Condition: Stable CODE STATUS: Full code Diet recommendation: 2 g salt diet  Brief/Interim Summary: 63 year old with a history of essential hypertension, hyperlipidemia, obstructive sleep apnea recently underwent prostate seeding by urology 2 days prior to the admission.  Upon admission patient was noted to be septic likely secondary to urinary tract infection from instrumentation.  Urine cultures eventually ended up growing E. coli which was pansensitive therefore transition to oral Keflex for 8 more days to complete 10-day course of antibiotics.  He was hydrating well. On the day of discharge he was ambulating well without any complaints and really wished to go home as he felt much better.   Discharge Diagnoses:  Principal Problem:   Sepsis ([place]) Active Problems:   HLD (hyperlipidemia)   OSA (obstructive sleep apnea)   Hypothyroidism   HTN (hypertension)   Prostate cancer (HCC)   UTI (urinary tract infection)  Sepsis secondary to urinary tract infection/E. coli from instrumentation -Initial pressure did well with IV antibiotics, transition to oral Keflex for 8 more days to complete 10-day course -Urinary culture data reviewed.  He remains afebrile, leukocytosis improved.  Tolerating oral diet and doing much better.  Will discharge in stable condition.  Advised to hydrate him well.  Essential hypertension -Resume home meds.  Advised to continue monitoring blood pressure at home.  Metoprolol has been added during the hospitalization.  History of prostate  cancer -Follow-up outpatient urology.  Underwent prostate seeding recently  Obstructive sleep apnea-continue CPAP  Hypothyroidism -Continue Synthroid.  Consultations:  None  Subjective: Feels much better, wishes to go home today.  Discharge Exam: Vitals:   09/15/19 0422 09/15/19 0740  BP: 121/85 122/76  Pulse: 68 70  Resp: 18 18  Temp: 98.7 F (37.1 C) 98.6 F (37 C)  SpO2: 100% 100%   Vitals:   09/14/19 1743 09/14/19 2018 09/15/19 0422 09/15/19 0740  BP: 140/61 127/78 121/85 122/76  Pulse: 75 73 68 70  Resp: 18 16 18 18   Temp: 98.9 F (37.2 C) 98.4 F (36.9 C) 98.7 F (37.1 C) 98.6 F (37 C)  TempSrc: Oral Oral Oral Oral  SpO2: 100% 100% 100% 100%  Weight:      Height:        General: Pt is alert, awake, not in acute distress Cardiovascular: RRR, S1/S2 +, no rubs, no gallops Respiratory: CTA bilaterally, no wheezing, no rhonchi Abdominal: Soft, NT, ND, bowel sounds + Extremities: no edema, no cyanosis  Discharge Instructions  Discharge Instructions    Diet - low sodium heart healthy   Complete by: As directed    Increase activity slowly   Complete by: As directed      Allergies as of 09/15/2019      Reactions   Lisinopril Cough      Medication List    TAKE these medications   cephALEXin 500 MG capsule Commonly known as: KEFLEX Take 1 capsule (500 mg total) by mouth 4 (four) times daily for 8 days.   levothyroxine 25 MCG tablet Commonly known as: SYNTHROID Take 25 mcg by mouth daily.   losartan 25 MG tablet Commonly  known as: COZAAR Take 25 mg by mouth daily.   metoprolol tartrate 25 MG tablet Commonly known as: LOPRESSOR Take 1 tablet (25 mg total) by mouth 2 (two) times daily.   simvastatin 10 MG tablet Commonly known as: ZOCOR TAKE 1 TABLET (10 MG TOTAL) BY MOUTH NIGHTLY.      Follow-up Information    Gauger, Victoriano Lain, NP. Schedule an appointment as soon as possible for a visit in 1 week(s).   Specialty: Internal  Medicine Contact information: [place] [address] [phone]          Allergies  Allergen Reactions  . Lisinopril Cough    You were cared for by a hospitalist during your hospital stay. If you have any questions about your discharge medications or the care you received while you were in the hospital after you are discharged, you can call the unit and asked to speak with the hospitalist on call if the hospitalist that took care of you is not available. Once you are discharged, your primary care physician will handle any further medical issues. Please note that no refills for any discharge medications will be authorized once you are discharged, as it is imperative that you return to your primary care physician (or establish a relationship with a primary care physician if you do not have one) for your aftercare needs so that they can reassess your need for medications and monitor your lab values.   Procedures/Studies: Dg Chest 2 View  Result Date: 09/12/2019 CLINICAL DATA:  Intermittent fever EXAM: CHEST - 2 VIEW COMPARISON:  None. FINDINGS: The heart size and mediastinal contours are within normal limits. Both lungs are clear. The visualized skeletal structures are unremarkable. IMPRESSION: No active cardiopulmonary disease. Electronically Signed   By: Donavan Foil M.D.   On: 09/12/2019 18:01   Nm Bone Scan Whole Body  Result Date: 08/21/2019 CLINICAL DATA:  Prostate cancer, PSA 10.5 EXAM: NUCLEAR MEDICINE WHOLE BODY BONE SCAN TECHNIQUE: Whole body anterior and posterior images were obtained approximately 3 hours after intravenous injection of radiopharmaceutical. RADIOPHARMACEUTICALS:  22.379 mCi Technetium-87m MDP IV COMPARISON:  None Radiographic correlation: CT abdomen and pelvis 06/27/2019 FINDINGS: Uptake at the shoulders, LEFT elbow, and knees, typically degenerative. Mild increased tracer accumulation at the RIGHT manubrium  superiorly, potentially related to RIGHT sternoclavicular joint but a metastatic focus is not excluded. No additional sites of abnormal osseous tracer accumulation are identified. Expected urinary tract and soft tissue distribution of tracer. IMPRESSION: Focus of abnormal increased tracer accumulation asymmetrically at the RIGHT superior manubrium, may be related to degenerative changes of the RIGHT sternoclavicular joint though a metastatic focus is not excluded; consider CT correlation. No additional concerning scintigraphic abnormalities. Electronically Signed   By: Lavonia Dana M.D.   On: 08/21/2019 08:17      The results of significant diagnostics from this hospitalization (including imaging, microbiology, ancillary and laboratory) are listed below for reference.     Microbiology: Recent Results (from the past 240 hour(s))  Culture, blood (Routine x 2)     Status: Abnormal (Preliminary result)   Collection Time: 09/12/19  5:27 PM   Specimen: BLOOD  Result Value Ref Range Status   Specimen Description   Final    BLOOD BLOOD RIGHT FOREARM Performed at [facility], [address] [place], [place] [address]    Special Requests   Final    BOTTLES DRAWN AEROBIC AND ANAEROBIC Blood Culture adequate volume Performed at  [facility], [address] [place], [place] [address]    Culture  Setup Time   Final    Organism ID to follow GRAM NEGATIVE RODS AEROBIC BOTTLE ONLY CRITICAL RESULT CALLED TO, READ BACK BY AND VERIFIED WITH: SCOTT HALL @0442  09/14/19 AKT Performed at [facility], [address], [place], [place] [address]    Culture (A)  Final    ESCHERICHIA COLI SUSCEPTIBILITIES TO FOLLOW Performed at [facility], [place] [address] [place], [place] [address]    Report Status PENDING  Incomplete  Blood Culture ID Panel (Reflexed)     Status: Abnormal   Collection Time: 09/12/19  5:27 PM  Result Value Ref Range Status   Enterococcus species NOT  DETECTED NOT DETECTED Final   Listeria monocytogenes NOT DETECTED NOT DETECTED Final   Staphylococcus species NOT DETECTED NOT DETECTED Final   Staphylococcus aureus (BCID) NOT DETECTED NOT DETECTED Final   Streptococcus species NOT DETECTED NOT DETECTED Final   Streptococcus agalactiae NOT DETECTED NOT DETECTED Final   Streptococcus pneumoniae NOT DETECTED NOT DETECTED Final   Streptococcus pyogenes NOT DETECTED NOT DETECTED Final   Acinetobacter baumannii NOT DETECTED NOT DETECTED Final   Enterobacteriaceae species DETECTED (A) NOT DETECTED Final    Comment: Enterobacteriaceae represent a large family of gram-negative bacteria, not a single organism. CRITICAL RESULT CALLED TO, READ BACK BY AND VERIFIED WITH: SCOTT HALL @0442  09/14/19 AKT    Enterobacter cloacae complex NOT DETECTED NOT DETECTED Final   Escherichia coli DETECTED (A) NOT DETECTED Final    Comment: CRITICAL RESULT CALLED TO, READ BACK BY AND VERIFIED WITH: SCOTT HALL @0442  09/14/19 AKT    Klebsiella oxytoca NOT DETECTED NOT DETECTED Final   Klebsiella pneumoniae NOT DETECTED NOT DETECTED Final   Proteus species NOT DETECTED NOT DETECTED Final   Serratia marcescens NOT DETECTED NOT DETECTED Final   Carbapenem resistance NOT DETECTED NOT DETECTED Final   Haemophilus influenzae NOT DETECTED NOT DETECTED Final   Neisseria meningitidis NOT DETECTED NOT DETECTED Final   Pseudomonas aeruginosa NOT DETECTED NOT DETECTED Final   Candida albicans NOT DETECTED NOT DETECTED Final   Candida glabrata NOT DETECTED NOT DETECTED Final   Candida krusei NOT DETECTED NOT DETECTED Final   Candida parapsilosis NOT DETECTED NOT DETECTED Final   Candida tropicalis NOT DETECTED NOT DETECTED Final    Comment: Performed at [facility], [address], [place], [place] [address]  Urine culture     Status: Abnormal   Collection Time: 09/12/19  6:44 PM   Specimen: In/Out Cath Urine  Result Value Ref Range Status   Specimen  Description   Final    IN/OUT CATH URINE Performed at [facility], [address] [place], [place] [address]    Special Requests   Final    NONE Performed at [facility], [place], [place] [address]    Culture 10,000 COLONIES/mL ESCHERICHIA COLI (A)  Final   Report Status 09/14/2019 FINAL  Final   Organism ID, Bacteria ESCHERICHIA COLI (A)  Final      Susceptibility   Escherichia coli - MIC*    AMPICILLIN 16 INTERMEDIATE Intermediate     CEFAZOLIN <=4 SENSITIVE Sensitive     CEFTRIAXONE <=1 SENSITIVE Sensitive     CIPROFLOXACIN >=4 RESISTANT Resistant     GENTAMICIN <=1 SENSITIVE Sensitive     IMIPENEM <=0.25 SENSITIVE Sensitive     NITROFURANTOIN <=16 SENSITIVE Sensitive     TRIMETH/SULFA <=20 SENSITIVE  Sensitive     AMPICILLIN/SULBACTAM 4 SENSITIVE Sensitive     PIP/TAZO <=4 SENSITIVE Sensitive     Extended ESBL NEGATIVE Sensitive     * 10,000 COLONIES/mL ESCHERICHIA COLI  SARS CORONAVIRUS 2 (TAT 6-24 HRS) Nasopharyngeal Nasopharyngeal Swab     Status: None   Collection Time: 09/12/19  6:44 PM   Specimen: Nasopharyngeal Swab  Result Value Ref Range Status   SARS Coronavirus 2 NEGATIVE NEGATIVE Final    Comment: (NOTE) SARS-CoV-2 target nucleic acids are NOT DETECTED. The SARS-CoV-2 RNA is generally detectable in upper and lower respiratory specimens during the acute phase of infection. Negative results do not preclude SARS-CoV-2 infection, do not rule out co-infections with other pathogens, and should not be used as the sole basis for treatment or other patient management decisions. Negative results must be combined with clinical observations, patient history, and epidemiological information. The expected result is Negative. Fact Sheet for Patients: SugarRoll.be Fact Sheet for Healthcare Providers: https://www.woods-mathews.com/ This test is not yet approved or cleared by the [place] FDA  and  has been authorized for detection and/or diagnosis of SARS-CoV-2 by FDA under an Emergency Use Authorization (EUA). This EUA will remain  in effect (meaning this test can be used) for the duration of the COVID-19 declaration under Section 56 4(b)(1) of the Act, 21 U.S.C. section 360bbb-3(b)(1), unless the authorization is terminated or revoked sooner. Performed at [facility], [place] [address] [place], [place] [address]   Culture, blood (Routine x 2)     Status: None (Preliminary result)   Collection Time: 09/12/19  6:59 PM   Specimen: BLOOD  Result Value Ref Range Status   Specimen Description BLOOD BLOOD LEFT FOREARM  Final   Special Requests   Final    BOTTLES DRAWN AEROBIC AND ANAEROBIC Blood Culture adequate volume   Culture   Final    NO GROWTH 3 DAYS Performed at [facility], [address] [place], [place] [address]    Report Status PENDING  Incomplete     Labs: BNP (last 3 results) No results for input(s): BNP in the last 8760 hours. Basic Metabolic Panel: Recent Labs  Lab 09/12/19 1727 09/13/19 0534 09/14/19 0648 09/15/19 0506  NA 134* 135  --  139  K 3.8 3.7  --  4.1  CL 101 104  --  107  CO2 23 19*  --  26  GLUCOSE 126* 112*  --  105*  BUN 13 13  --  14  CREATININE 1.22 1.13 1.15 1.03  CALCIUM 9.5 9.1  --  9.0   Liver Function Tests: Recent Labs  Lab 09/12/19 1727  AST 20  ALT 18  ALKPHOS 76  BILITOT 1.3*  PROT 7.7  ALBUMIN 4.3   No results for input(s): LIPASE, AMYLASE in the last 168 hours. No results for input(s): AMMONIA in the last 168 hours. CBC: Recent Labs  Lab 09/12/19 1727 09/13/19 0534 09/15/19 0506  WBC 9.4 7.9 4.0  NEUTROABS 7.6  --  1.8  HGB 14.5 13.4 12.8*  HCT 42.1 40.3 38.6*  MCV 91.1 94.2 91.9  PLT 125* 110* 115*   Cardiac Enzymes: No results for input(s): CKTOTAL, CKMB, CKMBINDEX, TROPONINI in the last 168 hours. BNP: Invalid input(s): POCBNP CBG: No results for input(s): GLUCAP in the  last 168 hours. D-Dimer No results for input(s): DDIMER in the last 72 hours. Hgb A1c No results for input(s): HGBA1C in the last 72 hours. Lipid Profile No results for input(s):  CHOL, HDL, LDLCALC, TRIG, CHOLHDL, LDLDIRECT in the last 72 hours. Thyroid function studies No results for input(s): TSH, T4TOTAL, T3FREE, THYROIDAB in the last 72 hours.  Invalid input(s): FREET3 Anemia work up No results for input(s): VITAMINB12, FOLATE, FERRITIN, TIBC, IRON, RETICCTPCT in the last 72 hours. Urinalysis    Component Value Date/Time   COLORURINE YELLOW (A) 09/12/2019 1844   APPEARANCEUR CLOUDY (A) 09/12/2019 1844   APPEARANCEUR Cloudy (A) 06/18/2019 1007   LABSPEC 1.001 (L) 09/12/2019 1844   PHURINE 6.0 09/12/2019 1844   GLUCOSEU NEGATIVE 09/12/2019 1844   HGBUR LARGE (A) 09/12/2019 1844   BILIRUBINUR NEGATIVE 09/12/2019 1844   BILIRUBINUR Negative 06/18/2019 1007   KETONESUR NEGATIVE 09/12/2019 1844   PROTEINUR NEGATIVE 09/12/2019 1844   NITRITE NEGATIVE 09/12/2019 1844   LEUKOCYTESUR LARGE (A) 09/12/2019 1844   Sepsis Labs Invalid input(s): PROCALCITONIN,  WBC,  LACTICIDVEN Microbiology Recent Results (from the past 240 hour(s))  Culture, blood (Routine x 2)     Status: Abnormal (Preliminary result)   Collection Time: 09/12/19  5:27 PM   Specimen: BLOOD  Result Value Ref Range Status   Specimen Description   Final    BLOOD BLOOD RIGHT FOREARM Performed at [facility], [address], [place], [place] [address]    Special Requests   Final    BOTTLES DRAWN AEROBIC AND ANAEROBIC Blood Culture adequate volume Performed at [facility], [address] [place], [place] [address]    Culture  Setup Time   Final    Organism ID to follow [place] CRITICAL RESULT CALLED TO, READ BACK BY AND VERIFIED WITH: [place] @0442  09/14/19 AKT Performed at [facility], [address], [place], [place] [address]    Culture (A)   Final    ESCHERICHIA COLI SUSCEPTIBILITIES TO FOLLOW Performed at [facility], [place] [address], [place], [place] [address]    Report Status PENDING  Incomplete  Blood Culture ID Panel (Reflexed)     Status: Abnormal   Collection Time: 09/12/19  5:27 PM  Result Value Ref Range Status   Enterococcus species NOT DETECTED NOT DETECTED Final   Listeria monocytogenes NOT DETECTED NOT DETECTED Final   Staphylococcus species NOT DETECTED NOT DETECTED Final   Staphylococcus aureus (BCID) NOT DETECTED NOT DETECTED Final   Streptococcus species NOT DETECTED NOT DETECTED Final   Streptococcus agalactiae NOT DETECTED NOT DETECTED Final   Streptococcus pneumoniae NOT DETECTED NOT DETECTED Final   Streptococcus pyogenes NOT DETECTED NOT DETECTED Final   Acinetobacter baumannii NOT DETECTED NOT DETECTED Final   Enterobacteriaceae species DETECTED (A) NOT DETECTED Final    Comment: Enterobacteriaceae represent a large family of gram-negative bacteria, not a single organism. CRITICAL RESULT CALLED TO, READ BACK BY AND VERIFIED WITH: SCOTT HALL @0442  09/14/19 AKT    Enterobacter cloacae complex NOT DETECTED NOT DETECTED Final   Escherichia coli DETECTED (A) NOT DETECTED Final    Comment: CRITICAL RESULT CALLED TO, READ BACK BY AND VERIFIED WITH: SCOTT HALL @0442  09/14/19 AKT    Klebsiella oxytoca NOT DETECTED NOT DETECTED Final   Klebsiella pneumoniae NOT DETECTED NOT DETECTED Final   Proteus species NOT DETECTED NOT DETECTED Final   Serratia marcescens NOT DETECTED NOT DETECTED Final   Carbapenem resistance NOT DETECTED NOT DETECTED Final   Haemophilus influenzae NOT DETECTED NOT DETECTED Final   Neisseria meningitidis NOT DETECTED NOT DETECTED Final   Pseudomonas aeruginosa NOT DETECTED NOT DETECTED Final   Candida albicans  NOT DETECTED NOT DETECTED Final   Candida glabrata NOT DETECTED NOT DETECTED Final   Candida krusei NOT DETECTED NOT DETECTED Final   Candida parapsilosis NOT DETECTED  NOT DETECTED Final   Candida tropicalis NOT DETECTED NOT DETECTED Final    Comment: Performed at [facility], [address] [place], [place] [address]  Urine culture     Status: Abnormal   Collection Time: 09/12/19  6:44 PM   Specimen: In/Out Cath Urine  Result Value Ref Range Status   Specimen Description   Final    IN/OUT CATH URINE Performed at [facility], [address] [place], [place] [address]    Special Requests   Final    NONE Performed at [facility], [address] [place], [place] [address]    Culture 10,000 COLONIES/mL ESCHERICHIA COLI (A)  Final   Report Status 09/14/2019 FINAL  Final   Organism ID, Bacteria ESCHERICHIA COLI (A)  Final      Susceptibility   Escherichia coli - MIC*    AMPICILLIN 16 INTERMEDIATE Intermediate     CEFAZOLIN <=4 SENSITIVE Sensitive     CEFTRIAXONE <=1 SENSITIVE Sensitive     CIPROFLOXACIN >=4 RESISTANT Resistant     GENTAMICIN <=1 SENSITIVE Sensitive     IMIPENEM <=0.25 SENSITIVE Sensitive     NITROFURANTOIN <=16 SENSITIVE Sensitive     TRIMETH/SULFA <=20 SENSITIVE Sensitive     AMPICILLIN/SULBACTAM 4 SENSITIVE Sensitive     PIP/TAZO <=4 SENSITIVE Sensitive     Extended ESBL NEGATIVE Sensitive     * 10,000 COLONIES/mL ESCHERICHIA COLI  SARS CORONAVIRUS 2 (TAT 6-24 HRS) Nasopharyngeal Nasopharyngeal Swab     Status: None   Collection Time: 09/12/19  6:44 PM   Specimen: Nasopharyngeal Swab  Result Value Ref Range Status   SARS Coronavirus 2 NEGATIVE NEGATIVE Final    Comment: (NOTE) SARS-CoV-2 target nucleic acids are NOT DETECTED. The SARS-CoV-2 RNA is generally detectable in upper and lower respiratory specimens during the acute phase of infection. Negative results do not preclude SARS-CoV-2 infection, do not rule out co-infections with other pathogens, and should not be used as the sole basis for treatment or other patient management decisions. Negative results must be combined with clinical  observations, patient history, and epidemiological information. The expected result is Negative. Fact Sheet for Patients: SugarRoll.be Fact Sheet for Healthcare Providers: https://www.woods-mathews.com/ This test is not yet approved or cleared by the [place] FDA and  has been authorized for detection and/or diagnosis of SARS-CoV-2 by FDA under an Emergency Use Authorization (EUA). This EUA will remain  in effect (meaning this test can be used) for the duration of the COVID-19 declaration under Section 56 4(b)(1) of the Act, 21 U.S.C. section 360bbb-3(b)(1), unless the authorization is terminated or revoked sooner. Performed at [facility], [place] [address] [place], [place] [address]   Culture, blood (Routine x 2)     Status: None (Preliminary result)   Collection Time: 09/12/19  6:59 PM   Specimen: BLOOD  Result Value Ref Range Status   Specimen Description BLOOD BLOOD LEFT FOREARM  Final   Special Requests   Final    BOTTLES DRAWN AEROBIC AND ANAEROBIC Blood Culture adequate volume   Culture   Final    NO GROWTH 3 DAYS Performed at [facility], [address], [place],  [address]    Report Status PENDING  Incomplete     Time coordinating discharge:  I have spent 35 minutes face  to face with the patient and on the ward discussing the patients care, assessment, plan and disposition with other care givers. >50% of the time was devoted counseling the patient about the risks and benefits of treatment/Discharge disposition and coordinating care.   SIGNED:   Damita Lack, MD  [facility] 09/15/2019, 3:16 PM   If 7PM-7AM, please contact night-coverage

## 2019-09-16 LAB — CULTURE, BLOOD (ROUTINE X 2): Special Requests: ADEQUATE

## 2019-09-17 ENCOUNTER — Telehealth: Payer: Self-pay | Admitting: Urology

## 2019-09-17 LAB — CULTURE, BLOOD (ROUTINE X 2)
Culture: NO GROWTH
Special Requests: ADEQUATE

## 2019-09-17 NOTE — Telephone Encounter (Signed)
Please call to check on him.    Looks like he had a mild urinary tract infection, grew low colony count of E. coli but this was resistant to Cipro which means is also likely resistant to the Levaquin gave him.  He did receive gentamicin (susceptible) in the office which is probably why he did not have severe sepsis.    He was sent home on the appropriate antibiotic.  Please have him contact us if he has any more issues including ongoing burning with urination, fevers or chills.  We can recheck his urine if he feels like he is now responding to the antibiotics.  Hollice Espy, MD

## 2019-09-17 NOTE — Telephone Encounter (Signed)
Patient has been improving-denies urinary symptoms. Verbalized understanding to call the office with any worsening urinary symptoms.

## 2019-09-17 NOTE — Telephone Encounter (Signed)
Pt was admitted to hospital this past weekend.  He contacted a fever.  He had gold seeds implanted last Tuesday.  He wanted to make Dr Erlene Quan aware.  She said for pt to let us know if he developed a fever.

## 2019-09-20 ENCOUNTER — Other Ambulatory Visit: Payer: Self-pay | Admitting: *Deleted

## 2019-09-20 DIAGNOSIS — C61 Malignant neoplasm of prostate: Secondary | ICD-10-CM

## 2019-09-23 ENCOUNTER — Other Ambulatory Visit: Payer: Self-pay

## 2019-09-23 ENCOUNTER — Ambulatory Visit
Admission: RE | Admit: 2019-09-23 | Discharge: 2019-09-23 | Disposition: A | Discharge status: Home / Self Care | Payer: Medicare Other | Source: Ambulatory Visit | Attending: Radiation Oncology | Admitting: Radiation Oncology

## 2019-09-23 DIAGNOSIS — C61 Malignant neoplasm of prostate: Secondary | ICD-10-CM | POA: Insufficient documentation

## 2019-09-23 DIAGNOSIS — Z510 Encounter for antineoplastic radiation therapy: Secondary | ICD-10-CM | POA: Insufficient documentation

## 2019-09-24 ENCOUNTER — Other Ambulatory Visit: Payer: Self-pay

## 2019-09-24 ENCOUNTER — Ambulatory Visit
Admission: RE | Admit: 2019-09-24 | Discharge: 2019-09-24 | Disposition: A | Discharge status: Home / Self Care | Payer: Medicare Other | Source: Ambulatory Visit | Attending: Radiation Oncology | Admitting: Radiation Oncology

## 2019-09-24 DIAGNOSIS — Z510 Encounter for antineoplastic radiation therapy: Secondary | ICD-10-CM | POA: Diagnosis not present

## 2019-09-24 DIAGNOSIS — C61 Malignant neoplasm of prostate: Secondary | ICD-10-CM | POA: Diagnosis present

## 2019-09-25 ENCOUNTER — Other Ambulatory Visit: Payer: Self-pay

## 2019-09-25 ENCOUNTER — Ambulatory Visit
Admission: RE | Admit: 2019-09-25 | Discharge: 2019-09-25 | Disposition: A | Discharge status: Home / Self Care | Payer: Medicare Other | Source: Ambulatory Visit | Attending: Radiation Oncology | Admitting: Radiation Oncology

## 2019-09-25 DIAGNOSIS — C61 Malignant neoplasm of prostate: Secondary | ICD-10-CM | POA: Diagnosis not present

## 2019-09-26 ENCOUNTER — Ambulatory Visit
Admission: RE | Admit: 2019-09-26 | Discharge: 2019-09-26 | Disposition: A | Discharge status: Home / Self Care | Payer: Medicare Other | Source: Ambulatory Visit | Attending: Radiation Oncology | Admitting: Radiation Oncology

## 2019-09-26 ENCOUNTER — Other Ambulatory Visit: Payer: Self-pay

## 2019-09-26 DIAGNOSIS — C61 Malignant neoplasm of prostate: Secondary | ICD-10-CM | POA: Diagnosis not present

## 2019-09-27 ENCOUNTER — Other Ambulatory Visit: Payer: Self-pay

## 2019-09-27 ENCOUNTER — Ambulatory Visit
Admission: RE | Admit: 2019-09-27 | Discharge: 2019-09-27 | Disposition: A | Discharge status: Home / Self Care | Payer: Medicare Other | Source: Ambulatory Visit | Attending: Radiation Oncology | Admitting: Radiation Oncology

## 2019-09-27 DIAGNOSIS — C61 Malignant neoplasm of prostate: Secondary | ICD-10-CM | POA: Diagnosis not present

## 2019-09-30 ENCOUNTER — Other Ambulatory Visit: Payer: Self-pay

## 2019-09-30 ENCOUNTER — Ambulatory Visit
Admission: RE | Admit: 2019-09-30 | Discharge: 2019-09-30 | Disposition: A | Discharge status: Home / Self Care | Payer: Medicare Other | Source: Ambulatory Visit | Attending: Radiation Oncology | Admitting: Radiation Oncology

## 2019-09-30 DIAGNOSIS — C61 Malignant neoplasm of prostate: Secondary | ICD-10-CM | POA: Diagnosis not present

## 2019-10-01 ENCOUNTER — Ambulatory Visit
Admission: RE | Admit: 2019-10-01 | Discharge: 2019-10-01 | Disposition: A | Discharge status: Home / Self Care | Payer: Medicare Other | Source: Ambulatory Visit | Attending: Radiation Oncology | Admitting: Radiation Oncology

## 2019-10-01 ENCOUNTER — Other Ambulatory Visit: Payer: Self-pay

## 2019-10-01 DIAGNOSIS — C61 Malignant neoplasm of prostate: Secondary | ICD-10-CM | POA: Diagnosis not present

## 2019-10-02 ENCOUNTER — Ambulatory Visit
Admission: RE | Admit: 2019-10-02 | Discharge: 2019-10-02 | Disposition: A | Discharge status: Home / Self Care | Payer: Medicare Other | Source: Ambulatory Visit | Attending: Radiation Oncology | Admitting: Radiation Oncology

## 2019-10-02 ENCOUNTER — Other Ambulatory Visit: Payer: Self-pay

## 2019-10-02 DIAGNOSIS — C61 Malignant neoplasm of prostate: Secondary | ICD-10-CM | POA: Diagnosis not present

## 2019-10-03 ENCOUNTER — Ambulatory Visit
Admission: RE | Admit: 2019-10-03 | Discharge: 2019-10-03 | Disposition: A | Discharge status: Home / Self Care | Payer: Medicare Other | Source: Ambulatory Visit | Attending: Radiation Oncology | Admitting: Radiation Oncology

## 2019-10-03 ENCOUNTER — Other Ambulatory Visit: Payer: Self-pay

## 2019-10-03 DIAGNOSIS — C61 Malignant neoplasm of prostate: Secondary | ICD-10-CM | POA: Diagnosis not present

## 2019-10-04 ENCOUNTER — Ambulatory Visit
Admission: RE | Admit: 2019-10-04 | Discharge: 2019-10-04 | Disposition: A | Discharge status: Home / Self Care | Payer: Medicare Other | Source: Ambulatory Visit | Attending: Radiation Oncology | Admitting: Radiation Oncology

## 2019-10-04 ENCOUNTER — Other Ambulatory Visit: Payer: Self-pay

## 2019-10-04 DIAGNOSIS — C61 Malignant neoplasm of prostate: Secondary | ICD-10-CM | POA: Diagnosis not present

## 2019-10-07 ENCOUNTER — Other Ambulatory Visit: Payer: Self-pay

## 2019-10-07 ENCOUNTER — Ambulatory Visit
Admission: RE | Admit: 2019-10-07 | Discharge: 2019-10-07 | Disposition: A | Discharge status: Home / Self Care | Payer: Medicare Other | Source: Ambulatory Visit | Attending: Radiation Oncology | Admitting: Radiation Oncology

## 2019-10-07 DIAGNOSIS — C61 Malignant neoplasm of prostate: Secondary | ICD-10-CM | POA: Diagnosis not present

## 2019-10-08 ENCOUNTER — Other Ambulatory Visit: Payer: Self-pay

## 2019-10-08 ENCOUNTER — Ambulatory Visit
Admission: RE | Admit: 2019-10-08 | Discharge: 2019-10-08 | Disposition: A | Discharge status: Home / Self Care | Payer: Medicare Other | Source: Ambulatory Visit | Attending: Radiation Oncology | Admitting: Radiation Oncology

## 2019-10-08 DIAGNOSIS — C61 Malignant neoplasm of prostate: Secondary | ICD-10-CM | POA: Diagnosis not present

## 2019-10-09 ENCOUNTER — Inpatient Hospital Stay: Payer: Medicare Other | Attending: Radiation Oncology

## 2019-10-09 ENCOUNTER — Other Ambulatory Visit: Payer: Self-pay

## 2019-10-09 ENCOUNTER — Ambulatory Visit
Admission: RE | Admit: 2019-10-09 | Discharge: 2019-10-09 | Disposition: A | Discharge status: Home / Self Care | Payer: Medicare Other | Source: Ambulatory Visit | Attending: Radiation Oncology | Admitting: Radiation Oncology

## 2019-10-09 DIAGNOSIS — C61 Malignant neoplasm of prostate: Secondary | ICD-10-CM | POA: Insufficient documentation

## 2019-10-09 LAB — CBC
HCT: 41.7 % (ref 39.0–52.0)
Hemoglobin: 13.6 g/dL (ref 13.0–17.0)
MCH: 30.3 pg (ref 26.0–34.0)
MCHC: 32.6 g/dL (ref 30.0–36.0)
MCV: 92.9 fL (ref 80.0–100.0)
Platelets: 98 10*3/uL — ABNORMAL LOW (ref 150–400)
RBC: 4.49 MIL/uL (ref 4.22–5.81)
RDW: 12 % (ref 11.5–15.5)
WBC: 3.2 10*3/uL — ABNORMAL LOW (ref 4.0–10.5)
nRBC: 0 % (ref 0.0–0.2)

## 2019-10-14 ENCOUNTER — Other Ambulatory Visit: Payer: Self-pay

## 2019-10-14 ENCOUNTER — Ambulatory Visit
Admission: RE | Admit: 2019-10-14 | Discharge: 2019-10-14 | Disposition: A | Discharge status: Home / Self Care | Payer: Medicare Other | Source: Ambulatory Visit | Attending: Radiation Oncology | Admitting: Radiation Oncology

## 2019-10-14 DIAGNOSIS — C61 Malignant neoplasm of prostate: Secondary | ICD-10-CM | POA: Diagnosis not present

## 2019-10-15 ENCOUNTER — Ambulatory Visit
Admission: RE | Admit: 2019-10-15 | Discharge: 2019-10-15 | Disposition: A | Discharge status: Home / Self Care | Payer: Medicare Other | Source: Ambulatory Visit | Attending: Radiation Oncology | Admitting: Radiation Oncology

## 2019-10-15 ENCOUNTER — Other Ambulatory Visit: Payer: Self-pay

## 2019-10-15 DIAGNOSIS — C61 Malignant neoplasm of prostate: Secondary | ICD-10-CM | POA: Diagnosis present

## 2019-10-15 DIAGNOSIS — Z510 Encounter for antineoplastic radiation therapy: Secondary | ICD-10-CM | POA: Insufficient documentation

## 2019-10-16 ENCOUNTER — Ambulatory Visit
Admission: RE | Admit: 2019-10-16 | Discharge: 2019-10-16 | Disposition: A | Discharge status: Home / Self Care | Payer: Medicare Other | Source: Ambulatory Visit | Attending: Radiation Oncology | Admitting: Radiation Oncology

## 2019-10-16 ENCOUNTER — Other Ambulatory Visit: Payer: Self-pay

## 2019-10-16 DIAGNOSIS — C61 Malignant neoplasm of prostate: Secondary | ICD-10-CM | POA: Diagnosis not present

## 2019-10-17 ENCOUNTER — Other Ambulatory Visit: Payer: Self-pay

## 2019-10-17 ENCOUNTER — Ambulatory Visit
Admission: RE | Admit: 2019-10-17 | Discharge: 2019-10-17 | Disposition: A | Discharge status: Home / Self Care | Payer: Medicare Other | Source: Ambulatory Visit | Attending: Radiation Oncology | Admitting: Radiation Oncology

## 2019-10-17 DIAGNOSIS — C61 Malignant neoplasm of prostate: Secondary | ICD-10-CM | POA: Diagnosis not present

## 2019-10-18 ENCOUNTER — Other Ambulatory Visit: Payer: Self-pay

## 2019-10-18 ENCOUNTER — Ambulatory Visit
Admission: RE | Admit: 2019-10-18 | Discharge: 2019-10-18 | Disposition: A | Discharge status: Home / Self Care | Payer: Medicare Other | Source: Ambulatory Visit | Attending: Radiation Oncology | Admitting: Radiation Oncology

## 2019-10-18 DIAGNOSIS — C61 Malignant neoplasm of prostate: Secondary | ICD-10-CM | POA: Diagnosis not present

## 2019-10-21 ENCOUNTER — Ambulatory Visit
Admission: RE | Admit: 2019-10-21 | Discharge: 2019-10-21 | Disposition: A | Discharge status: Home / Self Care | Payer: Medicare Other | Source: Ambulatory Visit | Attending: Radiation Oncology | Admitting: Radiation Oncology

## 2019-10-21 ENCOUNTER — Other Ambulatory Visit: Payer: Self-pay

## 2019-10-21 DIAGNOSIS — C61 Malignant neoplasm of prostate: Secondary | ICD-10-CM | POA: Diagnosis not present

## 2019-10-22 ENCOUNTER — Other Ambulatory Visit: Payer: Self-pay

## 2019-10-22 ENCOUNTER — Ambulatory Visit
Admission: RE | Admit: 2019-10-22 | Discharge: 2019-10-22 | Disposition: A | Discharge status: Home / Self Care | Payer: Medicare Other | Source: Ambulatory Visit | Attending: Radiation Oncology | Admitting: Radiation Oncology

## 2019-10-22 DIAGNOSIS — C61 Malignant neoplasm of prostate: Secondary | ICD-10-CM | POA: Diagnosis not present

## 2019-10-23 ENCOUNTER — Ambulatory Visit
Admission: RE | Admit: 2019-10-23 | Discharge: 2019-10-23 | Disposition: A | Discharge status: Home / Self Care | Payer: Medicare Other | Source: Ambulatory Visit | Attending: Radiation Oncology | Admitting: Radiation Oncology

## 2019-10-23 ENCOUNTER — Other Ambulatory Visit: Payer: Self-pay

## 2019-10-23 ENCOUNTER — Inpatient Hospital Stay: Payer: Medicare Other | Attending: Radiation Oncology

## 2019-10-23 DIAGNOSIS — C61 Malignant neoplasm of prostate: Secondary | ICD-10-CM | POA: Diagnosis present

## 2019-10-23 LAB — CBC
HCT: 41.2 % (ref 39.0–52.0)
Hemoglobin: 13.5 g/dL (ref 13.0–17.0)
MCH: 30.5 pg (ref 26.0–34.0)
MCHC: 32.8 g/dL (ref 30.0–36.0)
MCV: 93 fL (ref 80.0–100.0)
Platelets: 99 10*3/uL — ABNORMAL LOW (ref 150–400)
RBC: 4.43 MIL/uL (ref 4.22–5.81)
RDW: 12.3 % (ref 11.5–15.5)
WBC: 3.5 10*3/uL — ABNORMAL LOW (ref 4.0–10.5)
nRBC: 0 % (ref 0.0–0.2)

## 2019-10-24 ENCOUNTER — Other Ambulatory Visit: Payer: Self-pay

## 2019-10-24 ENCOUNTER — Ambulatory Visit
Admission: RE | Admit: 2019-10-24 | Discharge: 2019-10-24 | Disposition: A | Discharge status: Home / Self Care | Payer: Medicare Other | Source: Ambulatory Visit | Attending: Radiation Oncology | Admitting: Radiation Oncology

## 2019-10-24 DIAGNOSIS — C61 Malignant neoplasm of prostate: Secondary | ICD-10-CM | POA: Diagnosis not present

## 2019-10-25 ENCOUNTER — Ambulatory Visit
Admission: RE | Admit: 2019-10-25 | Discharge: 2019-10-25 | Disposition: A | Discharge status: Home / Self Care | Payer: Medicare Other | Source: Ambulatory Visit | Attending: Radiation Oncology | Admitting: Radiation Oncology

## 2019-10-25 ENCOUNTER — Other Ambulatory Visit: Payer: Self-pay

## 2019-10-25 DIAGNOSIS — C61 Malignant neoplasm of prostate: Secondary | ICD-10-CM | POA: Diagnosis not present

## 2019-10-28 ENCOUNTER — Ambulatory Visit
Admission: RE | Admit: 2019-10-28 | Discharge: 2019-10-28 | Disposition: A | Discharge status: Home / Self Care | Payer: Medicare Other | Source: Ambulatory Visit | Attending: Radiation Oncology | Admitting: Radiation Oncology

## 2019-10-28 ENCOUNTER — Other Ambulatory Visit: Payer: Self-pay

## 2019-10-28 DIAGNOSIS — C61 Malignant neoplasm of prostate: Secondary | ICD-10-CM | POA: Diagnosis not present

## 2019-10-29 ENCOUNTER — Other Ambulatory Visit: Payer: Self-pay

## 2019-10-29 ENCOUNTER — Other Ambulatory Visit: Payer: Self-pay | Admitting: *Deleted

## 2019-10-29 ENCOUNTER — Ambulatory Visit
Admission: RE | Admit: 2019-10-29 | Discharge: 2019-10-29 | Disposition: A | Discharge status: Home / Self Care | Payer: Medicare Other | Source: Ambulatory Visit | Attending: Radiation Oncology | Admitting: Radiation Oncology

## 2019-10-29 DIAGNOSIS — C61 Malignant neoplasm of prostate: Secondary | ICD-10-CM | POA: Diagnosis not present

## 2019-10-29 MED ORDER — TAMSULOSIN HCL 0.4 MG PO CAPS: 0.4000 mg | ORAL_CAPSULE | Freq: Every day | ORAL | 12 refills | Status: DC

## 2019-10-30 ENCOUNTER — Ambulatory Visit
Admission: RE | Admit: 2019-10-30 | Discharge: 2019-10-30 | Disposition: A | Discharge status: Home / Self Care | Payer: Medicare Other | Source: Ambulatory Visit | Attending: Radiation Oncology | Admitting: Radiation Oncology

## 2019-10-30 ENCOUNTER — Other Ambulatory Visit: Payer: Self-pay

## 2019-10-30 DIAGNOSIS — C61 Malignant neoplasm of prostate: Secondary | ICD-10-CM | POA: Diagnosis not present

## 2019-10-31 ENCOUNTER — Ambulatory Visit
Admission: RE | Admit: 2019-10-31 | Discharge: 2019-10-31 | Disposition: A | Discharge status: Home / Self Care | Payer: Medicare Other | Source: Ambulatory Visit | Attending: Radiation Oncology | Admitting: Radiation Oncology

## 2019-10-31 ENCOUNTER — Other Ambulatory Visit: Payer: Self-pay

## 2019-10-31 DIAGNOSIS — C61 Malignant neoplasm of prostate: Secondary | ICD-10-CM | POA: Diagnosis not present

## 2019-11-01 ENCOUNTER — Other Ambulatory Visit: Payer: Self-pay

## 2019-11-01 ENCOUNTER — Ambulatory Visit
Admission: RE | Admit: 2019-11-01 | Discharge: 2019-11-01 | Disposition: A | Discharge status: Home / Self Care | Payer: Medicare Other | Source: Ambulatory Visit | Attending: Radiation Oncology | Admitting: Radiation Oncology

## 2019-11-01 DIAGNOSIS — C61 Malignant neoplasm of prostate: Secondary | ICD-10-CM | POA: Diagnosis not present

## 2019-11-04 ENCOUNTER — Ambulatory Visit
Admission: RE | Admit: 2019-11-04 | Discharge: 2019-11-04 | Disposition: A | Discharge status: Home / Self Care | Payer: Medicare Other | Source: Ambulatory Visit | Attending: Radiation Oncology | Admitting: Radiation Oncology

## 2019-11-04 ENCOUNTER — Other Ambulatory Visit: Payer: Self-pay

## 2019-11-04 DIAGNOSIS — C61 Malignant neoplasm of prostate: Secondary | ICD-10-CM | POA: Diagnosis not present

## 2019-11-05 ENCOUNTER — Ambulatory Visit
Admission: RE | Admit: 2019-11-05 | Discharge: 2019-11-05 | Disposition: A | Discharge status: Home / Self Care | Payer: Medicare Other | Source: Ambulatory Visit | Attending: Radiation Oncology | Admitting: Radiation Oncology

## 2019-11-05 ENCOUNTER — Other Ambulatory Visit: Payer: Self-pay

## 2019-11-05 DIAGNOSIS — C61 Malignant neoplasm of prostate: Secondary | ICD-10-CM | POA: Diagnosis not present

## 2019-11-06 ENCOUNTER — Other Ambulatory Visit: Payer: Self-pay

## 2019-11-06 ENCOUNTER — Inpatient Hospital Stay: Payer: Medicare Other

## 2019-11-06 ENCOUNTER — Ambulatory Visit
Admission: RE | Admit: 2019-11-06 | Discharge: 2019-11-06 | Disposition: A | Discharge status: Home / Self Care | Payer: Medicare Other | Source: Ambulatory Visit | Attending: Radiation Oncology | Admitting: Radiation Oncology

## 2019-11-06 DIAGNOSIS — C61 Malignant neoplasm of prostate: Secondary | ICD-10-CM

## 2019-11-06 LAB — CBC
HCT: 39.9 % (ref 39.0–52.0)
Hemoglobin: 13 g/dL (ref 13.0–17.0)
MCH: 30.4 pg (ref 26.0–34.0)
MCHC: 32.6 g/dL (ref 30.0–36.0)
MCV: 93.4 fL (ref 80.0–100.0)
Platelets: 111 10*3/uL — ABNORMAL LOW (ref 150–400)
RBC: 4.27 MIL/uL (ref 4.22–5.81)
RDW: 12.7 % (ref 11.5–15.5)
WBC: 3.4 10*3/uL — ABNORMAL LOW (ref 4.0–10.5)
nRBC: 0 % (ref 0.0–0.2)

## 2019-11-07 ENCOUNTER — Ambulatory Visit
Admission: RE | Admit: 2019-11-07 | Discharge: 2019-11-07 | Disposition: A | Discharge status: Home / Self Care | Payer: Medicare Other | Source: Ambulatory Visit | Attending: Radiation Oncology | Admitting: Radiation Oncology

## 2019-11-07 ENCOUNTER — Other Ambulatory Visit: Payer: Self-pay

## 2019-11-07 DIAGNOSIS — C61 Malignant neoplasm of prostate: Secondary | ICD-10-CM | POA: Diagnosis not present

## 2019-11-11 ENCOUNTER — Other Ambulatory Visit: Payer: Self-pay

## 2019-11-11 ENCOUNTER — Ambulatory Visit
Admission: RE | Admit: 2019-11-11 | Discharge: 2019-11-11 | Disposition: A | Discharge status: Home / Self Care | Payer: Medicare Other | Source: Ambulatory Visit | Attending: Radiation Oncology | Admitting: Radiation Oncology

## 2019-11-11 DIAGNOSIS — C61 Malignant neoplasm of prostate: Secondary | ICD-10-CM | POA: Diagnosis not present

## 2019-11-12 ENCOUNTER — Ambulatory Visit
Admission: RE | Admit: 2019-11-12 | Discharge: 2019-11-12 | Disposition: A | Discharge status: Home / Self Care | Payer: Medicare Other | Source: Ambulatory Visit | Attending: Radiation Oncology | Admitting: Radiation Oncology

## 2019-11-12 ENCOUNTER — Other Ambulatory Visit: Payer: Self-pay

## 2019-11-12 DIAGNOSIS — C61 Malignant neoplasm of prostate: Secondary | ICD-10-CM | POA: Diagnosis not present

## 2019-11-13 ENCOUNTER — Other Ambulatory Visit: Payer: Self-pay

## 2019-11-13 ENCOUNTER — Ambulatory Visit
Admission: RE | Admit: 2019-11-13 | Discharge: 2019-11-13 | Disposition: A | Discharge status: Home / Self Care | Payer: Medicare Other | Source: Ambulatory Visit | Attending: Radiation Oncology | Admitting: Radiation Oncology

## 2019-11-13 DIAGNOSIS — C61 Malignant neoplasm of prostate: Secondary | ICD-10-CM | POA: Diagnosis not present

## 2019-11-14 ENCOUNTER — Other Ambulatory Visit: Payer: Self-pay

## 2019-11-14 ENCOUNTER — Ambulatory Visit
Admission: RE | Admit: 2019-11-14 | Discharge: 2019-11-14 | Disposition: A | Discharge status: Home / Self Care | Payer: Medicare Other | Source: Ambulatory Visit | Attending: Radiation Oncology | Admitting: Radiation Oncology

## 2019-11-14 DIAGNOSIS — C61 Malignant neoplasm of prostate: Secondary | ICD-10-CM | POA: Diagnosis not present

## 2019-11-18 ENCOUNTER — Ambulatory Visit
Admission: RE | Admit: 2019-11-18 | Discharge: 2019-11-18 | Disposition: A | Discharge status: Home / Self Care | Payer: Medicare Other | Source: Ambulatory Visit | Attending: Radiation Oncology | Admitting: Radiation Oncology

## 2019-11-18 ENCOUNTER — Other Ambulatory Visit: Payer: Self-pay

## 2019-11-18 DIAGNOSIS — Z510 Encounter for antineoplastic radiation therapy: Secondary | ICD-10-CM | POA: Diagnosis not present

## 2019-11-18 DIAGNOSIS — C61 Malignant neoplasm of prostate: Secondary | ICD-10-CM | POA: Insufficient documentation

## 2019-11-19 ENCOUNTER — Ambulatory Visit
Admission: RE | Admit: 2019-11-19 | Discharge: 2019-11-19 | Disposition: A | Discharge status: Home / Self Care | Payer: Medicare Other | Source: Ambulatory Visit | Attending: Radiation Oncology | Admitting: Radiation Oncology

## 2019-11-19 ENCOUNTER — Other Ambulatory Visit: Payer: Self-pay

## 2019-11-19 DIAGNOSIS — C61 Malignant neoplasm of prostate: Secondary | ICD-10-CM | POA: Diagnosis not present

## 2019-11-20 ENCOUNTER — Ambulatory Visit
Admission: RE | Admit: 2019-11-20 | Discharge: 2019-11-20 | Disposition: A | Discharge status: Home / Self Care | Payer: Medicare Other | Source: Ambulatory Visit | Attending: Radiation Oncology | Admitting: Radiation Oncology

## 2019-11-20 ENCOUNTER — Other Ambulatory Visit: Payer: Self-pay

## 2019-11-20 DIAGNOSIS — C61 Malignant neoplasm of prostate: Secondary | ICD-10-CM | POA: Diagnosis not present

## 2019-11-21 ENCOUNTER — Ambulatory Visit
Admission: RE | Admit: 2019-11-21 | Discharge: 2019-11-21 | Disposition: A | Discharge status: Home / Self Care | Payer: Medicare Other | Source: Ambulatory Visit | Attending: Radiation Oncology | Admitting: Radiation Oncology

## 2019-11-21 ENCOUNTER — Other Ambulatory Visit: Payer: Self-pay

## 2019-11-21 DIAGNOSIS — C61 Malignant neoplasm of prostate: Secondary | ICD-10-CM | POA: Diagnosis not present

## 2019-11-22 ENCOUNTER — Ambulatory Visit
Admission: RE | Admit: 2019-11-22 | Discharge: 2019-11-22 | Disposition: A | Discharge status: Home / Self Care | Payer: Medicare Other | Source: Ambulatory Visit | Attending: Radiation Oncology | Admitting: Radiation Oncology

## 2019-11-22 ENCOUNTER — Other Ambulatory Visit: Payer: Self-pay

## 2019-11-22 DIAGNOSIS — C61 Malignant neoplasm of prostate: Secondary | ICD-10-CM | POA: Diagnosis not present

## 2019-12-09 DIAGNOSIS — E669 Obesity, unspecified: Secondary | ICD-10-CM | POA: Insufficient documentation

## 2019-12-16 ENCOUNTER — Telehealth: Payer: Self-pay

## 2019-12-16 DIAGNOSIS — C61 Malignant neoplasm of prostate: Secondary | ICD-10-CM

## 2019-12-16 NOTE — Telephone Encounter (Signed)
Survivorship Care Plan visit completed.  Treatment summary reviewed and will be mailed to patient.  ASCO answers booklet reviewed and mailed to patient.  CARE program and Cancer Transitions discussed with patient along with other resources cancer center offers to patients and caregivers.  Patient verbalized understanding.  Pt aware APP will be calling him in 2 weeks to discuss SCP packet.   Packet mailed.

## 2019-12-24 ENCOUNTER — Other Ambulatory Visit: Payer: Self-pay

## 2019-12-25 ENCOUNTER — Other Ambulatory Visit: Payer: Self-pay | Admitting: *Deleted

## 2019-12-25 ENCOUNTER — Ambulatory Visit
Admission: RE | Admit: 2019-12-25 | Discharge: 2019-12-25 | Disposition: A | Discharge status: Home / Self Care | Payer: Medicare Other | Source: Ambulatory Visit | Attending: Radiation Oncology | Admitting: Radiation Oncology

## 2019-12-25 ENCOUNTER — Other Ambulatory Visit: Payer: Self-pay

## 2019-12-25 VITALS — BP 122/72 | HR 107 | Temp 96.4°F | Resp 20 | Wt 216.9 lb

## 2019-12-25 DIAGNOSIS — R232 Flushing: Secondary | ICD-10-CM | POA: Insufficient documentation

## 2019-12-25 DIAGNOSIS — N529 Male erectile dysfunction, unspecified: Secondary | ICD-10-CM | POA: Insufficient documentation

## 2019-12-25 DIAGNOSIS — R61 Generalized hyperhidrosis: Secondary | ICD-10-CM | POA: Insufficient documentation

## 2019-12-25 DIAGNOSIS — C61 Malignant neoplasm of prostate: Secondary | ICD-10-CM

## 2019-12-25 DIAGNOSIS — Z923 Personal history of irradiation: Secondary | ICD-10-CM | POA: Diagnosis not present

## 2019-12-25 MED ORDER — SILDENAFIL CITRATE 25 MG PO TABS: 25.0000 mg | ORAL_TABLET | Freq: Every day | ORAL | 3 refills | Status: DC | PRN

## 2019-12-25 NOTE — Progress Notes (Signed)
Radiation Oncology Follow up Note  Name: Chad Cross   Date:   12/25/2019 MRN:  [id number] DOB: [birth year]    This 64 y.o. male presents to the clinic today for 1 month follow-up status post.  IMRT radiation therapy to the prostate for Gleason 7 (3+4) adenocarcinoma the prostate presenting with a PSA of 8.8  REFERRING PROVIDER: [name], *  HPI: Patient is a 64 year old male now seen out 1 month having completed external beam treatment to his prostate for Gleason 7 (3+4) adenocarcinoma the prostate presenting with a PSA of 8.8 seen today in routine follow-up he is doing well does have nocturia x1 no GI problems.  He also has problems with erectile dysfunction.  He is currently on androgen deprivation therapy and having some night sweats..  COMPLICATIONS OF TREATMENT: none  FOLLOW UP COMPLIANCE: keeps appointments   PHYSICAL EXAM:  BP 122/72 (BP Location: Left Arm, Patient Position: Sitting, Cuff Size: Normal)   Pulse (!) 107   Temp (!) 96.4 F (35.8 C) (Tympanic)   Resp 20   Wt 216 lb 14.4 oz (98.4 kg)   BMI 31.12 kg/m  Well-developed well-nourished patient in NAD. HEENT reveals PERLA, EOMI, discs not visualized.  Oral cavity is clear. No oral mucosal lesions are identified. Neck is clear without evidence of cervical or supraclavicular adenopathy. Lungs are clear to A&P. Cardiac examination is essentially unremarkable with regular rate and rhythm without murmur rub or thrill. Abdomen is benign with no organomegaly or masses noted. Motor sensory and DTR levels are equal and symmetric in the upper and lower extremities. Cranial nerves II through XII are grossly intact. Proprioception is intact. No peripheral adenopathy or edema is identified. No motor or sensory levels are noted. Crude visual fields are within normal range.  RADIOLOGY RESULTS: No current films to review  PLAN: Present time patient is has a low side effect profile from his radiation therapy.  I have assured  in the androgen deprivation therapy post radiation is a contributing to his erectile dysfunction and I am starting him on generic Viagra as directed.  Also suggested vitamin E which she has been taking for his hot flashes.  I will see him back in 3 months for follow-up with a PSA prior to that visit.  Patient knows to call sooner with any concerns.  I would like to take this opportunity to thank you for allowing me to participate in the care of your patient.Noreene Filbert, MD

## 2019-12-30 ENCOUNTER — Inpatient Hospital Stay: Payer: Medicare Other | Attending: Nurse Practitioner | Admitting: Oncology

## 2019-12-30 DIAGNOSIS — C61 Malignant neoplasm of prostate: Secondary | ICD-10-CM | POA: Diagnosis not present

## 2019-12-30 NOTE — Progress Notes (Signed)
CLINIC:  Survivorship   REASON FOR VISIT:  Routine follow-up post-treatment for a recent history of prostate cancer.   I connected with Chad Cross on 12/30/19 at  9:00 AM EST by telephone visit and verified that I am speaking with the correct person using two identifiers.   I discussed the limitations, risks, security and privacy concerns of performing an evaluation and management service by telemedicine and the availability of in-person appointments. I also discussed with the patient that there may be a patient responsible charge related to this service. The patient expressed understanding and agreed to proceed.   Other persons participating in the visit and their role in the encounter: None  Patient's location: Home Provider's location: Work  BRIEF ONCOLOGIC HISTORY:  Oncology History Overview Note  Patient is a 64 year old male originally evaluated back in 2017 for microscopic hematuria and had an elevated PSA at that time.  He was lost to follow-up.  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  Most recent PSA was 8.8 in July 2020.  His brother is status post prostate cancer with prostatectomy.  He underwent transrectal ultrasound-guided biopsies of his prostate for a prostate volume of 46.6 g.  5 of 12 cores were positive for mostly Gleason 7 (3+4).  One left core was a Gleason 8 (5+3).  He has been seen by urology and treatment options including surgical resection with robotic prostatectomy have been reviewed.  He is seen today for radiation oncology opinion.  He is fairly asymptomatic has nocturia x2 no specific bowel issues.  He is already stated he is adamant against having surgery.   Prostate cancer (Chad Cross)  09/12/2019 Initial Diagnosis   Prostate cancer [facility]     INTERVAL HISTORY:  Chad Cross presents to the [facility] today for our initial meeting to review his  survivorship care plan detailing his treatment course for prostate cancer, as well as monitoring long-term side effects of that treatment, education regarding health maintenance, screening, and overall wellness and health promotion.     Overall, Chad Cross reports feeling quite well since completing radiation.  Only concern he has today is frequent urination and hot flashes which he has been seen by Dr. Donella Stade and appears to be improving.  He was started on Vitamin E.   He had recent follow-up with Dr. Donella Stade on 12/25/2019 for 3 months post radiation. Only notable side effect was erectile dysfunction and he was started on Viagra.  Scheduled for routine follow-up in approximately 3 months.  Prior to that he had been hospitalized from 09/12/2019 through 09/15/2019 for what appeared to be sepsis secondary to UTI from instrumentation.  He had recently had prostate seeding with urology 2 days prior.  Urine cultures grew E. coli which were pansensitive therefore sent home on oral Keflex for approximately 10 days.  On 09/10/2019 patient underwent gold seed markers by Dr. Erlene Quan.  Tolerated procedure well.  He was given gentamicin prophylactically and p.o. Levaquin.  He received IMRT from 09/23/2019-11/22/2019.  REVIEW OF SYSTEMS:  Review of Systems  Constitutional: Negative.  Negative for appetite change, chills, fatigue and fever.  HENT:  Negative.  Negative for hearing loss, lump/mass, mouth sores and nosebleeds.   Eyes: Negative.  Negative for eye problems.  Respiratory: Negative for cough, hemoptysis and shortness of breath.   Cardiovascular: Negative.  Negative for chest pain and leg swelling.  Gastrointestinal: Negative.  Negative for abdominal  pain, blood in stool, constipation, diarrhea, nausea and vomiting.  Endocrine: Positive for hot flashes.  Genitourinary: Negative.  Negative for bladder incontinence, difficulty urinating, dysuria, frequency and hematuria.        Frequent urination    Musculoskeletal: Negative.  Negative for back pain, flank pain, gait problem and myalgias.  Skin: Negative.  Negative for itching and rash.  Neurological: Negative.  Negative for dizziness, gait problem, headaches, light-headedness and numbness.  Hematological: Negative.  Negative for adenopathy.  Psychiatric/Behavioral: Negative for confusion. The patient is not nervous/anxious.     ONCOLOGY TREATMENT TEAM:  1.  Urologist: Dr. Erlene Quan 2. Radiation Oncologist: Dr. Donella Stade    PAST MEDICAL/SURGICAL HISTORY:  Past Medical History:  Diagnosis Date  . Anxiety 02/03/2016  . Apnea, sleep 11/15/2007   Overview:  Has CPAP but is unable to tolerate the mask due to anxiety.   . Blood dyscrasia   . Cytopenia 02/03/2016  . Elevated prostate specific antigen (PSA) 11/11/2015  . HLD (hyperlipidemia) 02/03/2016  . Hypertension   . Prostate cancer ([place])   . Subclinical hypothyroidism 11/11/2015   Past Surgical History:  Procedure Laterality Date  . none       ALLERGIES:  Allergies  Allergen Reactions  . Lisinopril Cough     CURRENT MEDICATIONS:  Outpatient Encounter Medications as of 12/30/2019  Medication Sig  . levothyroxine (SYNTHROID, LEVOTHROID) 25 MCG tablet Take 25 mcg by mouth daily.  [name] losartan (COZAAR) 25 MG tablet Take 25 mg by mouth daily.  . metoprolol tartrate (LOPRESSOR) 25 MG tablet Take 1 tablet (25 mg total) by mouth 2 (two) times daily.  . sildenafil (VIAGRA) 25 MG tablet Take 1 tablet (25 mg total) by mouth daily as needed for erectile dysfunction.  . simvastatin (ZOCOR) 10 MG tablet TAKE 1 TABLET (10 MG TOTAL) BY MOUTH NIGHTLY.  . tamsulosin (FLOMAX) 0.4 MG CAPS capsule Take 1 capsule (0.4 mg total) by mouth daily after supper.   No facility-administered encounter medications on file as of 12/30/2019.     ONCOLOGIC FAMILY HISTORY:  Family History  Problem Relation Age of Onset  . Prostate cancer Brother   . Bladder Cancer Neg Hx   . Kidney cancer Neg Hx      GENETIC COUNSELING/TESTING: None  SOCIAL HISTORY:  Chad Cross is married and lives in [place] with his wife. Mr. Hed is a current smoker.   PHYSICAL EXAMINATION:  Vital Signs: Limited due to virtual visit.  There were no vitals filed for this visit. There were no vitals filed for this visit.  LABORATORY DATA:  Lab Results  Component Value Date   WBC 3.4 (L) 11/06/2019   HGB 13.0 11/06/2019   HCT 39.9 11/06/2019   MCV 93.4 11/06/2019   PLT 111 (L) 11/06/2019     Chemistry      Component Value Date/Time   NA 139 09/15/2019 0506   K 4.1 09/15/2019 0506   CL 107 09/15/2019 0506   CO2 26 09/15/2019 0506   BUN 14 09/15/2019 0506   CREATININE 1.03 09/15/2019 0506      Component Value Date/Time   CALCIUM 9.0 09/15/2019 0506   ALKPHOS 76 09/12/2019 1727   AST 20 09/12/2019 1727   ALT 18 09/12/2019 1727   BILITOT 1.3 (H) 09/12/2019 1727      DIAGNOSTIC IMAGING:  Bone scan 08/20/2019  IMPRESSION: Focus of abnormal increased tracer accumulation asymmetrically at the RIGHT superior manubrium, may be related to degenerative changes of the RIGHT sternoclavicular joint though  a metastatic focus is not excluded; consider CT correlation.  No additional concerning scintigraphic abnormalities.   ASSESSMENT: Mr. Kooyman is a 64 year old male status post IMRT radiation therapy to the prostate for Gleason 7 (3+4) adenocarcinoma with a presenting PSA of 8.8.  He is followed by Dr. Erlene Quan for lab work and [employer] every 6 months.He presents to the Survivorship Clinic for our initial meeting and routine follow-up after radiation treatment.   1.  Prostate cancer Gleason score 7 (3+4):  Mr. Watwood is doing well since completing IMRT with Dr. Donella Stade.  He has scheduled follow-up with Dr. Donella Stade in May 2021.  He is followed by Dr. Erlene Quan with serial PSA and Eligard.Today, a comprehensive survivorship care plan and treatment summary was reviewed with the patient today detailing  his diagnosis treatment course, potential late/long-term effects of treatment, appropriate follow-up care with recommendations for the future, and patient education resources.  A copy of this summary, along with a letter will be sent to the patient's primary care provider via mail/fax/In Basket message after today's visit.    He is currently receiving Eligard androgen suppression and experiencing hot flashes.  He was recommended he began vitamin E.  He also was experiencing erectile dysfunction and was recently prescribed OTC Viagra.  #. Problem(s) at Visit: None.  #. Cancer screening:  Due to Mr. Shells's history and her age, she should receive screening for skin cancers and colon cancer.  The information and recommendations are listed on the patient's comprehensive care plan/treatment summary and were reviewed in detail with the patient.    #. Health maintenance and wellness promotion: Mr. Alberda was encouraged to consume 5-7 servings of fruits and vegetables per day. We reviewed resources such as "Take Control of Your Health and Reduce Your Cancer Risk" from the [place] for him to review. He was also encouraged to engage in moderate to vigorous exercise for 30 minutes per day most days of the week. We discussed the Cancer Transition program that [place] discussed with him during their visit.  He was instructed to limit her alcohol consumption and continue to try and reduce the amount he is smoking.    #. Support services/counseling: It is not uncommon for this period of the patient's cancer care trajectory to be one of many emotions and stressors.  Mr. Scholes was encouraged to take advantage of our many other support services programs, support groups, and/or counseling in coping with her new life as a cancer survivor after completing anti-cancer treatment.  He was offered support today through active listening and expressive supportive counseling.  He was given information regarding our  available services and encouraged to contact me with any questions or for help enrolling in any of our support group/programs.    Dispo:   -RTC on 02/24/20 to follow-up with Dr. Baruch Gouty.  -Follow-up as scheduled with Dr. Erlene Quan.  -Continue Vitamin E supplements for hot flashes.  -Continue Viagra for erectile dysfunction. -He is welcome to return back to the Survivorship Clinic at any time; no additional follow-up needed at this time.  -Consider referral back to survivorship as a long-term survivor for continued surveillance  I provided 15 minutes of non face-to-face telephone visit time during this encounter, and > 50% was spent counseling as documented under my assessment & plan.  Rulon Abide, NP AGNP-C [facility]   Note: PRIMARY CARE PROVIDER [name], [place] [phone]

## 2020-01-17 NOTE — Addendum Note (Signed)
Addended by: Faythe Casa E on: 01/17/2020 01:52 PM   Modules accepted: Level of Service

## 2020-03-02 ENCOUNTER — Other Ambulatory Visit: Payer: Self-pay

## 2020-03-02 DIAGNOSIS — R972 Elevated prostate specific antigen [PSA]: Secondary | ICD-10-CM

## 2020-03-03 ENCOUNTER — Other Ambulatory Visit: Payer: Self-pay

## 2020-03-03 ENCOUNTER — Other Ambulatory Visit: Payer: Medicare Other

## 2020-03-03 DIAGNOSIS — R972 Elevated prostate specific antigen [PSA]: Secondary | ICD-10-CM

## 2020-03-04 LAB — PSA: Prostate Specific Ag, Serum: <0.1 ng/mL (ref 0.0–4.0)

## 2020-03-09 NOTE — Progress Notes (Signed)
03/10/20 9:16 PM   Chad Cross May 07, 1956 [id number]  Referring provider: [name], NP [address] [place],  [place] [address] Chief Complaint  Patient presents with  . Prostate Cancer    34mo follow up    HPI: Chad Cross is a 64 y.o. M who returns today for 6 month f/u for the evaluation and management of high risk prostate cancer.   He underwent a prostate biopsy in 07/2019.  TRUS volume 46.6 g.  Rectal exam somewhat limited by habitus but possible nodule apex.  Unfortunately, this showed evidence of high risk prostate cancer on the left.  This involved a total of 5 of 12 cores.  He had most of his disease at the left base involving 100% of Gleason 3+4 tissue as well as 93% of Gleason 5+4 at the left lateral and left base.  He had additional cortically sent 3+4, up for present the tissue at the left lateral mid as well as a single core of 3+3 at the left apex.  He received his last injection of Eligard 6 month 45 mg on 09/03/2019. Thereafter, he underwent prostate gold seed marker placement on 09/10/2019 in anticipation of ADT w/ IMRT followed by Dr. Baruch Gouty.   Most recent PSA undetectable.   He reports of witnessing hot flashes, weight gain and erection diffuclties. He has not been taking his Vitamin D supplements. He was given Jerilynn Som for his erections however he was hesitant to take it due to possible cross interactions w/ radiation   He reports of nocturia x2 which is not bothersome.   Component     Latest Ref Rng & Units 02/03/2016 06/13/2019 06/18/2019 03/03/2020  Prostate Specific Ag, Serum     0.0 - 4.0 ng/mL 4.2 (H) 8.8 (H) 10.5 (H) <0.1    PMH: Past Medical History:  Diagnosis Date  . Anxiety 02/03/2016  . Apnea, sleep 11/15/2007   Overview:  Has CPAP but is unable to tolerate the mask due to anxiety.   . Blood dyscrasia   . Cytopenia 02/03/2016  . Elevated prostate specific antigen (PSA) 11/11/2015  . HLD (hyperlipidemia) 02/03/2016  . Hypertension     . Prostate cancer ([place])   . Subclinical hypothyroidism 11/11/2015    Surgical History: Past Surgical History:  Procedure Laterality Date  . none      Home Medications:  Allergies as of 03/10/2020      Reactions   Lisinopril Cough      Medication List       Accurate as of March 10, 2020 11:59 PM. If you have any questions, ask your nurse or doctor.        STOP taking these medications   metoprolol tartrate 25 MG tablet Commonly known as: LOPRESSOR Stopped by: Hollice Espy, MD     TAKE these medications   levothyroxine 50 MCG tablet Commonly known as: SYNTHROID Take by mouth. What changed: Another medication with the same name was removed. Continue taking this medication, and follow the directions you see here. Changed by: Hollice Espy, MD   losartan 25 MG tablet Commonly known as: COZAAR Take 25 mg by mouth daily.   metoprolol succinate 25 MG 24 hr tablet Commonly known as: TOPROL-XL Take by mouth.   sildenafil 25 MG tablet Commonly known as: VIAGRA Take 1 tablet (25 mg total) by mouth daily as needed for erectile dysfunction.   simvastatin 10 MG tablet Commonly known as: ZOCOR TAKE 1 TABLET (10 MG TOTAL) BY MOUTH NIGHTLY.  tamsulosin 0.4 MG Caps capsule Commonly known as: FLOMAX Take 1 capsule (0.4 mg total) by mouth daily after supper.       Allergies:  Allergies  Allergen Reactions  . Lisinopril Cough    Family History: Family History  Problem Relation Age of Onset  . Prostate cancer Brother   . Bladder Cancer Neg Hx   . Kidney cancer Neg Hx     Social History:  reports that he has been smoking cigarettes. He has been smoking about 1.00 pack per day. He has never used smokeless tobacco. He reports current alcohol use. He reports that he does not use drugs.   Physical Exam: BP (!) 160/93   Pulse 94   Ht 5\' 10"  (1.778 m)   Wt 216 lb (98 kg)   BMI 30.99 kg/m   Constitutional:  Alert and oriented, No acute distress. HEENT: [place] AT,  moist mucus membranes.  Trachea midline, no masses. Cardiovascular: No clubbing, cyanosis, or edema. Respiratory: Normal respiratory effort, no increased work of breathing. Skin: No rashes, bruises or suspicious lesions. Neurologic: Grossly intact, no focal deficits, moving all 4 extremities. Psychiatric: Normal mood and affect.  Assessment & Plan:    1. Prostate cancer Most recent PSA undetectable  Eligard 6 months depot today  Discussed importance of bone health on ADT, recommend 1000-1200 mg daily calcium suppliment and [phone] IU vit D daily.  Also encouraged weight being exercises and cardiovascular health. Anticipate hot flashes to decrease w/ time, improving  Return in 6 months w/ PSA/Eligard   2. Erectile dysfunction  Multifactorial  Agree w/ sildenafil prn  Reassured pt Sildenafil is safe and effective    Return in about 6 months (around 09/09/2020) for psa/ eligard.  [facility] [address] [place], [place] [address] [phone]  I, Lucas Mallow, am acting as a scribe for Dr. Hollice Espy,  I have reviewed the above documentation for accuracy and completeness, and I agree with the above.   Hollice Espy, MD

## 2020-03-10 ENCOUNTER — Ambulatory Visit: Payer: Medicare Other | Admitting: Urology

## 2020-03-10 ENCOUNTER — Encounter: Payer: Self-pay | Admitting: Urology

## 2020-03-10 ENCOUNTER — Other Ambulatory Visit: Payer: Self-pay

## 2020-03-10 VITALS — BP 160/93 | HR 94 | Ht 70.0 in | Wt 216.0 lb

## 2020-03-10 DIAGNOSIS — C61 Malignant neoplasm of prostate: Secondary | ICD-10-CM

## 2020-03-10 MED ORDER — LEUPROLIDE ACETATE (6 MONTH) 45 MG ~~LOC~~ KIT
45.0000 mg | PACK | Freq: Once | SUBCUTANEOUS | Status: AC
  Administered 2020-03-10: 45 mg via SUBCUTANEOUS

## 2020-03-10 NOTE — Progress Notes (Signed)
Eligard SubQ Injection   Due to Prostate Cancer patient is present today for a Eligard Injection.  Medication: Eligard 6 month Dose: 45 mg  Location: right  Lot: [id number] Exp: 11/2021  Patient tolerated well, no complications were noted  Performed by: Fonnie Jarvis, CMA  Per Dr. Erlene Quan patient is to continue therapy for 2years . Patient's next follow up was scheduled for September 08, 2020 . This appointment was scheduled using wheel and given to patient today along with reminder continue on Vitamin D 800-1000iu and Calium 1000-1200mg  daily while on Androgen Deprivation Therapy.  PA approval dates:

## 2020-03-10 NOTE — Patient Instructions (Addendum)
Discussed importance of bone health on ADT, recommend 1000-1200 mg daily calcium suppliment and 800-1000 IU vit D daily.  Also encouraged weight being exercises and cardiovascular health.  

## 2020-03-18 ENCOUNTER — Other Ambulatory Visit: Payer: Medicare Other

## 2020-03-24 ENCOUNTER — Other Ambulatory Visit: Payer: Self-pay

## 2020-03-25 ENCOUNTER — Ambulatory Visit
Admission: RE | Admit: 2020-03-25 | Discharge: 2020-03-25 | Disposition: A | Discharge status: Home / Self Care | Payer: Medicare Other | Source: Ambulatory Visit | Attending: Radiation Oncology | Admitting: Radiation Oncology

## 2020-03-25 VITALS — BP 167/82 | HR 87 | Temp 94.9°F | Resp 18 | Wt 219.1 lb

## 2020-03-25 DIAGNOSIS — C61 Malignant neoplasm of prostate: Secondary | ICD-10-CM | POA: Diagnosis present

## 2020-03-25 DIAGNOSIS — Z923 Personal history of irradiation: Secondary | ICD-10-CM | POA: Insufficient documentation

## 2020-03-25 NOTE — Progress Notes (Signed)
Radiation Oncology Follow up Note  Name: Chad Cross   Date:   03/25/2020 MRN:  [id number] DOB: Mar 14, 1956    This 64 y.o. male presents to the clinic today for 74-month follow-up status post IMRT radiation therapy to his prostate for Gleason 7 (3+4) adenocarcinoma presenting with a PSA of 8.8.  REFERRING PROVIDER: [name], *  HPI: Patient is a 64 year old male now out 4 months having completed IMRT radiation therapy to his prostate for a Gleason 7 adenocarcinoma presents with a PSA of 8.8 seen today in routine follow-up he is doing well.  He specifically denies any increased lower urinary tract symptoms diarrhea or fatigue.  His most recent PSA is less than 0.1.[name]  COMPLICATIONS OF TREATMENT: none  FOLLOW UP COMPLIANCE: keeps appointments   PHYSICAL EXAM:  BP (!) 167/82 (BP Location: Left Arm, Patient Position: Sitting)   Pulse 87   Temp (!) 94.9 F (34.9 C) (Tympanic)   Resp 18   Wt 219 lb 1.6 oz (99.4 kg)   BMI 31.44 kg/m  Well-developed well-nourished patient in NAD. HEENT reveals PERLA, EOMI, discs not visualized.  Oral cavity is clear. No oral mucosal lesions are identified. Neck is clear without evidence of cervical or supraclavicular adenopathy. Lungs are clear to A&P. Cardiac examination is essentially unremarkable with regular rate and rhythm without murmur rub or thrill. Abdomen is benign with no organomegaly or masses noted. Motor sensory and DTR levels are equal and symmetric in the upper and lower extremities. Cranial nerves II through XII are grossly intact. Proprioception is intact. No peripheral adenopathy or edema is identified. No motor or sensory levels are noted. Crude visual fields are within normal range.  RADIOLOGY RESULTS: No current films to review  PLAN: Present time patient is under excellent biochemical control of his prostate cancer.  I am pleased with his overall progress.  He has a very low side effect profile.  I have asked to see him back  in 6 months for follow-up with a PSA prior to that visit.  Patient knows to call sooner with any concerns.  I would like to take this opportunity to thank you for allowing me to participate in the care of your patient.Noreene Filbert, MD

## 2020-05-19 ENCOUNTER — Telehealth: Payer: Self-pay

## 2020-05-19 NOTE — Telephone Encounter (Signed)
Form submitted for verification of benefits for Eligard/Lupron

## 2020-05-26 NOTE — Telephone Encounter (Signed)
Eligard PA submitted via [facility] portal and approved x 23yr. Approval #[id number]

## 2020-08-27 ENCOUNTER — Other Ambulatory Visit: Payer: Self-pay

## 2020-08-27 DIAGNOSIS — C61 Malignant neoplasm of prostate: Secondary | ICD-10-CM

## 2020-08-31 ENCOUNTER — Other Ambulatory Visit: Payer: Medicare Other

## 2020-08-31 ENCOUNTER — Other Ambulatory Visit: Payer: Self-pay

## 2020-08-31 DIAGNOSIS — C61 Malignant neoplasm of prostate: Secondary | ICD-10-CM

## 2020-09-01 LAB — PSA: Prostate Specific Ag, Serum: <0.1 ng/mL (ref 0.0–4.0)

## 2020-09-07 NOTE — Progress Notes (Signed)
09/08/2020 2:50 PM   Chad Cross Jul 01, 1956 [id number]  Referring provider: [name], NP [address] [place],  [place] [address] Chief Complaint  Patient presents with  . Prostate Cancer    44mo    HPI: Chad Cross is a 64 y.o. male who returns for a 6 month follow up of prostate cancer, erectile dysfunction and Eligard.   He underwent a prostate biopsy in 07/2019. TRUSvolume 46.6 g. Rectal exam somewhat limited by habitus but possible nodule apex.  Unfortunately, this showed evidence of high risk prostate cancer on the left. This involved a total of 5 of 12 cores. He had most of his disease at the left base involving 100% of Gleason 3+4 tissue as well as 93% of Gleason 5+4 at the left lateral and left base. He had additional cortically sent 3+4, up for present the tissue at the left lateral mid as well as a single core of 3+3 at the left apex.  Patient underwent prostate gold seed marker placement on 09/10/2019 in anticipation of ADT w/ IMRT followed by Dr. Baruch Gouty.   Last Eligard 6 month 45 mg injection was on 03/10/2020.   PSA was undetectable on 08/31/2020.  Patient is fully vaccinated and recently had his booster shot. He is tolerating Eligard. He has hot flashes. He is taking calcium and vitamin D daily.   Patient is physically active.   PSA trend: Component     Latest Ref Rng & Units 02/03/2016 06/13/2019 06/18/2019 03/03/2020  Prostate Specific Ag, Serum     0.0 - 4.0 ng/mL 4.2 (H) 8.8 (H) 10.5 (H) <0.1   Component     Latest Ref Rng & Units 08/31/2020  Prostate Specific Ag, Serum     0.0 - 4.0 ng/mL <0.1    PMH: Past Medical History:  Diagnosis Date  . Anxiety 02/03/2016  . Apnea, sleep 11/15/2007   Overview:  Has CPAP but is unable to tolerate the mask due to anxiety.   . Blood dyscrasia   . Cytopenia 02/03/2016  . Elevated prostate specific antigen (PSA) 11/11/2015  . HLD (hyperlipidemia) 02/03/2016  . Hypertension   . Prostate cancer  ([place])   . Subclinical hypothyroidism 11/11/2015    Surgical History: Past Surgical History:  Procedure Laterality Date  . none      Home Medications:  Allergies as of 09/08/2020      Reactions   Lisinopril Cough      Medication List       Accurate as of September 08, 2020  2:50 PM. If you have any questions, ask your nurse or doctor.        levothyroxine 50 MCG tablet Commonly known as: SYNTHROID Take by mouth.   losartan 100 MG tablet Commonly known as: COZAAR Take 100 mg by mouth daily. What changed: Another medication with the same name was removed. Continue taking this medication, and follow the directions you see here. Changed by: Hollice Espy, MD   metoprolol succinate 50 MG 24 hr tablet Commonly known as: TOPROL-XL Take 50 mg by mouth daily. What changed: Another medication with the same name was removed. Continue taking this medication, and follow the directions you see here. Changed by: Hollice Espy, MD   rosuvastatin 10 MG tablet Commonly known as: CRESTOR Take 10 mg by mouth at bedtime.   sildenafil 25 MG tablet Commonly known as: VIAGRA Take 1 tablet (25 mg total) by mouth daily as needed for erectile dysfunction.   simvastatin 20 MG tablet Commonly  known as: ZOCOR Take 20 mg by mouth at bedtime. What changed: Another medication with the same name was removed. Continue taking this medication, and follow the directions you see here. Changed by: Hollice Espy, MD   tamsulosin 0.4 MG Caps capsule Commonly known as: FLOMAX Take 1 capsule (0.4 mg total) by mouth daily after supper.       Allergies:  Allergies  Allergen Reactions  . Lisinopril Cough    Family History: Family History  Problem Relation Age of Onset  . Prostate cancer Brother   . Bladder Cancer Neg Hx   . Kidney cancer Neg Hx     Social History:  reports that he has been smoking cigarettes. He has been smoking about 1.00 pack per day. He has never used smokeless tobacco.  He reports current alcohol use. He reports that he does not use drugs.   Physical Exam: BP (!) 154/85   Pulse 96   Ht 5\' 10"  (1.778 m)   Wt 244 lb (110.7 kg)   BMI 35.01 kg/m   Constitutional:  Alert and oriented, No acute distress. HEENT: [place] AT, moist mucus membranes.  Trachea midline, no masses. Cardiovascular: No clubbing, cyanosis, or edema. Respiratory: Normal respiratory effort, no increased work of breathing. Skin: No rashes, bruises or suspicious lesions. Neurologic: Grossly intact, no focal deficits, moving all 4 extremities. Psychiatric: Normal mood and affect.   Assessment & Plan:    1. Prostate cancer , high risk Patient is doing well.  PSA is undetectable. Eligard 6 month depot today. Continue calcium and vitamin D daily.  ADT planned for total of 2-3 years s/p XRT  RTC in 6 months with PSA prior.    [facility] [address] [place], [place] [address] [phone]  I, Selena Batten, am acting as a scribe for Dr. Hollice Espy.  I have reviewed the above documentation for accuracy and completeness, and I agree with the above.   Hollice Espy, MD

## 2020-09-08 ENCOUNTER — Ambulatory Visit (INDEPENDENT_AMBULATORY_CARE_PROVIDER_SITE_OTHER): Payer: Medicare Other | Admitting: Urology

## 2020-09-08 ENCOUNTER — Other Ambulatory Visit: Payer: Self-pay

## 2020-09-08 ENCOUNTER — Encounter: Payer: Self-pay | Admitting: Urology

## 2020-09-08 VITALS — BP 154/85 | HR 96 | Ht 70.0 in | Wt 244.0 lb

## 2020-09-08 DIAGNOSIS — C61 Malignant neoplasm of prostate: Secondary | ICD-10-CM

## 2020-09-08 NOTE — Progress Notes (Signed)
Eligard SubQ Injection   Due to Prostate Cancer patient is present today for a Eligard Injection.  Medication: Eligard 6 month Dose: 45 mg  Location: right abdomen Lot: [id number] Exp: 01/2022  Patient tolerated well, no complications were noted  Performed by: Fonnie Jarvis, CMA  Per Dr. Erlene Quan patient is to continue therapy for a total of 3years . Patient's next follow up was scheduled for 03/09/2021 This appointment was scheduled using wheel and given to patient today along with reminder continue on Vitamin D 800-1000iu and Calium 1000-1200mg  daily while on Androgen Deprivation Therapy.  PA approval dates:  Eligard PA submitted via [facility] portal and approved x 4yr 05/2021 Approval #[id number]

## 2020-09-24 ENCOUNTER — Ambulatory Visit: Admission: RE | Admit: 2020-09-24 | Payer: Medicare Other | Source: Ambulatory Visit | Admitting: Radiation Oncology

## 2020-10-21 ENCOUNTER — Encounter: Payer: Self-pay | Admitting: Radiation Oncology

## 2020-10-23 ENCOUNTER — Ambulatory Visit
Admission: RE | Admit: 2020-10-23 | Discharge: 2020-10-23 | Disposition: A | Discharge status: Home / Self Care | Payer: Medicare Other | Source: Ambulatory Visit | Attending: Radiation Oncology | Admitting: Radiation Oncology

## 2020-10-23 ENCOUNTER — Encounter: Payer: Self-pay | Admitting: Radiation Oncology

## 2020-10-23 VITALS — BP 153/82 | HR 83 | Temp 96.0°F | Wt 223.0 lb

## 2020-10-23 DIAGNOSIS — C61 Malignant neoplasm of prostate: Secondary | ICD-10-CM

## 2020-10-23 NOTE — Progress Notes (Signed)
Radiation Oncology Follow up Note  Name: Chad Cross   Date:   10/23/2020 MRN:  [id number] DOB: June 11, 1956    This 64 y.o. male presents to the clinic today for 57-month follow-up status post IMRT radiation therapy to his prostate for a Gleason 7 (3+4) adenocarcinoma presenting with a PSA of 8.8.  REFERRING PROVIDER: [name], *  HPI: Patient is a 64 year old male now out 10 months having completed IMRT radiation therapy for Gleason 7 adenocarcinoma the prostate seen today in routine follow-up he is doing well specifically denies any increased lower urinary tract symptoms diarrhea or fatigue.  Most recent PSA is 0.1.[name]  There is absolutely stable from 7 months prior.  He is currently on ADT therapy through urology.  COMPLICATIONS OF TREATMENT: none  FOLLOW UP COMPLIANCE: keeps appointments   PHYSICAL EXAM:  BP (!) 153/82   Pulse 83   Temp (!) 96 F (35.6 C) (Tympanic)   Wt 223 lb (101.2 kg)   BMI 32.00 kg/m  Well-developed well-nourished patient in NAD. HEENT reveals PERLA, EOMI, discs not visualized.  Oral cavity is clear. No oral mucosal lesions are identified. Neck is clear without evidence of cervical or supraclavicular adenopathy. Lungs are clear to A&P. Cardiac examination is essentially unremarkable with regular rate and rhythm without murmur rub or thrill. Abdomen is benign with no organomegaly or masses noted. Motor sensory and DTR levels are equal and symmetric in the upper and lower extremities. Cranial nerves II through XII are grossly intact. Proprioception is intact. No peripheral adenopathy or edema is identified. No motor or sensory levels are noted. Crude visual fields are within normal range.  RADIOLOGY RESULTS: No current films for review  PLAN: Present time patient is doing well with excellent control of his prostate cancer by biochemical criteria.  He continues on ADT therapy through urology.  I am pleased with his overall progress.  Of asked to see him  back in 6 months for follow-up.  Patient knows to call with any concerns at any time.  I would like to take this opportunity to thank you for allowing me to participate in the care of your patient.Noreene Filbert, MD

## 2021-03-02 ENCOUNTER — Other Ambulatory Visit: Payer: Self-pay | Admitting: *Deleted

## 2021-03-05 ENCOUNTER — Other Ambulatory Visit: Payer: Self-pay

## 2021-03-08 ENCOUNTER — Other Ambulatory Visit: Payer: Self-pay

## 2021-03-08 ENCOUNTER — Other Ambulatory Visit: Payer: Medicare Other

## 2021-03-08 DIAGNOSIS — C61 Malignant neoplasm of prostate: Secondary | ICD-10-CM

## 2021-03-09 ENCOUNTER — Ambulatory Visit: Payer: Medicare Other | Admitting: Urology

## 2021-03-09 ENCOUNTER — Ambulatory Visit: Payer: Self-pay | Admitting: Urology

## 2021-03-09 VITALS — BP 142/86 | HR 89 | Ht 70.0 in | Wt 220.0 lb

## 2021-03-09 DIAGNOSIS — N522 Drug-induced erectile dysfunction: Secondary | ICD-10-CM | POA: Diagnosis not present

## 2021-03-09 DIAGNOSIS — R232 Flushing: Secondary | ICD-10-CM

## 2021-03-09 DIAGNOSIS — C61 Malignant neoplasm of prostate: Secondary | ICD-10-CM

## 2021-03-09 LAB — PSA: Prostate Specific Ag, Serum: <0.1 ng/mL (ref 0.0–4.0)

## 2021-03-09 MED ORDER — LEUPROLIDE ACETATE (6 MONTH) 45 MG ~~LOC~~ KIT
45.0000 mg | PACK | Freq: Once | SUBCUTANEOUS | Status: AC
  Administered 2021-03-09: 45 mg via SUBCUTANEOUS

## 2021-03-09 MED ORDER — SILDENAFIL CITRATE 20 MG PO TABS: ORAL_TABLET | ORAL | 11 refills | Status: DC

## 2021-03-09 MED ORDER — ELIGARD 45 MG ~~LOC~~ KIT: 45.0000 mg | PACK | SUBCUTANEOUS | 1 refills | Status: DC

## 2021-03-09 NOTE — Progress Notes (Signed)
03/09/2021 12:55 PM   Chad Cross [birth year] [id number]  Referring provider: [name], NP [address] [place],  [place] [address]  No chief complaint on file.   HPI: Chad Cross is a 65 year old male who returns today for 63-month follow-up for Eligard/PSA.  He underwentaprostate biopsyin 07/2019. TRUSvolume 46.6 g. Rectal exam somewhat limited by habitus but possible nodule apex.  Unfortunately, this showed evidence of high risk prostate cancer on the left. This involved a total of 5 of 12 cores. He had most of his disease at the left base involving 100% of Gleason 3+4 tissue as well as 93% of Gleason 5+4 at the left lateral and left base. He had additional cortically sent 3+4, up for present the tissue at the left lateral mid as well as a single core of 3+3 at the left apex.  Patient underwent prostate gold seed marker placement on 09/10/2019 in anticipation of ADT w/ IMRT followed by Dr. Baruch Gouty.   Last Eligard 6 month 45 mg injection was on 09/08/2020.  He continues to struggle with hot flashes, is taking vitamin supplement to help with this.  He also mentions today that he has had decreased libido but also decreased quality of his erections when he is interested in activity.  He asked today about penile shockwave.  Never previously tried Viagra/sildenafil.  Component     Latest Ref Rng & Units 06/13/2019 06/18/2019 03/03/2020 08/31/2020  Prostate Specific Ag, Serum     0.0 - 4.0 ng/mL 8.8 (H) 10.5 (H) <0.1 <0.1   Component     Latest Ref Rng & Units 03/08/2021  Prostate Specific Ag, Serum     0.0 - 4.0 ng/mL <0.1      PMH: Past Medical History:  Diagnosis Date  . Anxiety 02/03/2016  . Apnea, sleep 11/15/2007   Overview:  Has CPAP but is unable to tolerate the mask due to anxiety.   . Blood dyscrasia   . Cytopenia 02/03/2016  . Elevated prostate specific antigen (PSA) 11/11/2015  . HLD (hyperlipidemia) 02/03/2016  . Hypertension   . Prostate  cancer ([place])   . Subclinical hypothyroidism 11/11/2015    Surgical History: Past Surgical History:  Procedure Laterality Date  . none      Home Medications:  Allergies as of 03/09/2021      Reactions   Lisinopril Cough      Medication List       Accurate as of March 09, 2021 11:59 PM. If you have any questions, ask your nurse or doctor.        levothyroxine 50 MCG tablet Commonly known as: SYNTHROID Take by mouth.   losartan 100 MG tablet Commonly known as: COZAAR Take 100 mg by mouth daily.   metoprolol succinate 50 MG 24 hr tablet Commonly known as: TOPROL-XL Take 50 mg by mouth daily.   rosuvastatin 10 MG tablet Commonly known as: CRESTOR Take 10 mg by mouth at bedtime.   sildenafil 20 MG tablet Commonly known as: REVATIO 3-5 Tablets one hour prior to intercourse   sildenafil 25 MG tablet Commonly known as: VIAGRA Take 1 tablet (25 mg total) by mouth daily as needed for erectile dysfunction.   simvastatin 20 MG tablet Commonly known as: ZOCOR Take 20 mg by mouth at bedtime.   tamsulosin 0.4 MG Caps capsule Commonly known as: FLOMAX Take 1 capsule (0.4 mg total) by mouth daily after supper.       Allergies:  Allergies  Allergen Reactions  . Lisinopril  Cough    Family History: Family History  Problem Relation Age of Onset  . Prostate cancer Brother   . Bladder Cancer Neg Hx   . Kidney cancer Neg Hx     Social History:  reports that he has been smoking cigarettes. He has been smoking about 1.00 pack per day. He has never used smokeless tobacco. He reports current alcohol use. He reports that he does not use drugs.   Physical Exam: BP (!) 142/86   Pulse 89   Ht 5\' 10"  (1.778 m)   Wt 220 lb (99.8 kg)   BMI 31.57 kg/m   Constitutional:  Alert and oriented, No acute distress. HEENT: [place] AT, moist mucus membranes.  Trachea midline, no masses. Cardiovascular: No clubbing, cyanosis, or edema. Respiratory: Normal respiratory effort, no  increased work of breathing. Skin: No rashes, bruises or suspicious lesions. Neurologic: Grossly intact, no focal deficits, moving all 4 extremities. Psychiatric: Normal mood and affect.  Assessment & Plan:    1. Prostate cancer (Chad Cross) No evidence of disease, PSA remains undetectable  Given his high risk disease, will continue ADT for total of 2 to 3 years, at least another year and then reassess  He is agreeable this plan  Reviewed bone health recommendations - leuprolide (6 Month) (ELIGARD) injection 45 mg - PSA; Future  2. Hot flashes Discussed alternative supplements such as black cohosh, plant estrogens to help with his hot flashes  Overall tolerable  3. Drug-induced erectile dysfunction Decreased libido with quality of erections, likely secondary to ADT as a contributing factor  We discussed the data behind penile shockwave, would recommend trial of sildenafil first to see if this improves the quality and duration of his erections.  Discussed possible side effects, all questions answered   F/u 6 months with PSA prior for ADT  Hollice Espy, MD  [facility] [address] [place], [place] [address] [phone]

## 2021-03-12 IMAGING — NM NM BONE WHOLE BODY
2 series · 6 of 6 positions shown · non-contrast
Comparison: None

Radiographic correlation: CT abdomen and pelvis 06/27/2019

CLINICAL DATA: Prostate cancer, PSA

EXAM:
NUCLEAR MEDICINE WHOLE BODY BONE SCAN
TECHNIQUE: Whole body anterior and posterior images were obtained approximately
3 hours after intravenous injection of radiopharmaceutical.
RADIOPHARMACEUTICALS:  22.379 mCi Rechnetium-66m MDP IV

[Series 1000: statics · 2.40mm/px · 2 acquisitions, 4 frames shown]
[im 1/2]
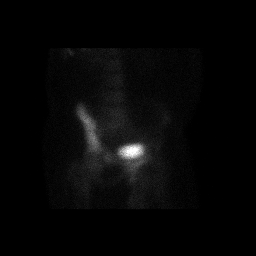
[im 1/2]
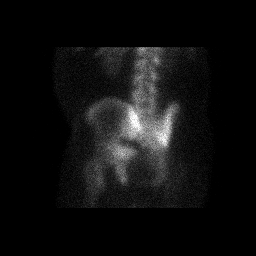
[im 2/2]
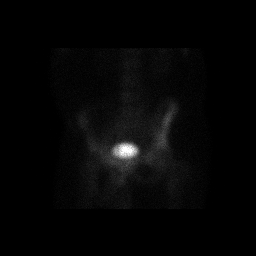
[im 2/2]
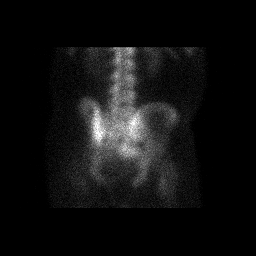

[Series 1000: 3 hr wholebody · 2.40mm/px · 2 of 2 frames shown]
[frame 1/2]
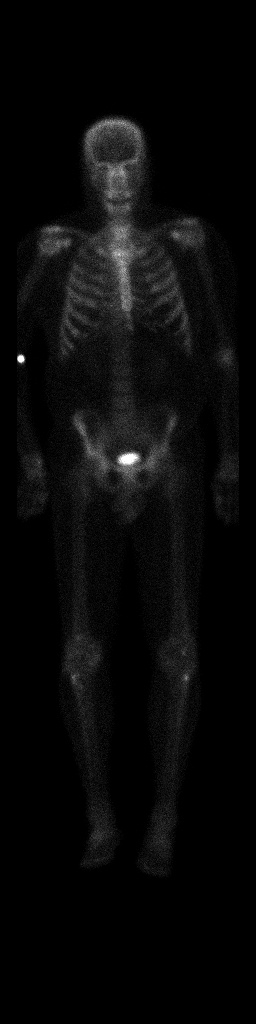
[frame 2/2]
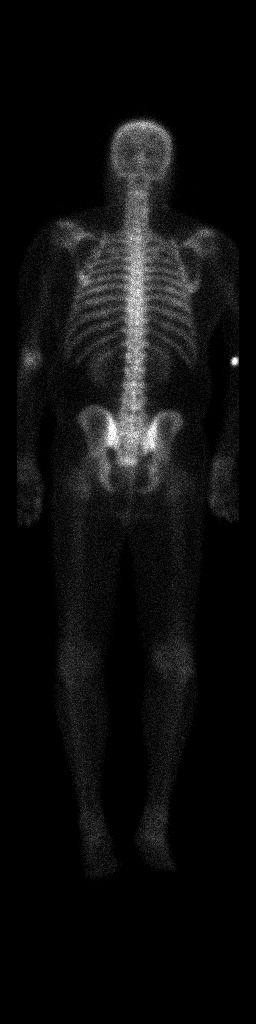

[6 of 6 positions shown; findings below may reference images not displayed]

FINDINGS: Uptake at the shoulders, LEFT elbow, and knees, typically
degenerative.

Mild increased tracer accumulation at the RIGHT manubrium
superiorly, potentially related to RIGHT sternoclavicular joint but
a metastatic focus is not excluded.

No additional sites of abnormal osseous tracer accumulation are
identified.

Expected urinary tract and soft tissue distribution of tracer.
IMPRESSION: Focus of abnormal increased tracer accumulation asymmetrically at
the RIGHT superior manubrium, may be related to degenerative changes
of the RIGHT sternoclavicular joint though a metastatic focus is not
excluded; consider CT correlation.

No additional concerning scintigraphic abnormalities.

## 2021-04-04 IMAGING — CR DG CHEST 2V
1 series · 2 of 2 positions shown · non-contrast
Comparison: None.

CLINICAL DATA: Intermittent fever

EXAM:
CHEST - 2 VIEW

[Series 1: dg chest 2 view · 0.14mm/px · 2 of 2 slices shown]
[im 1/2]
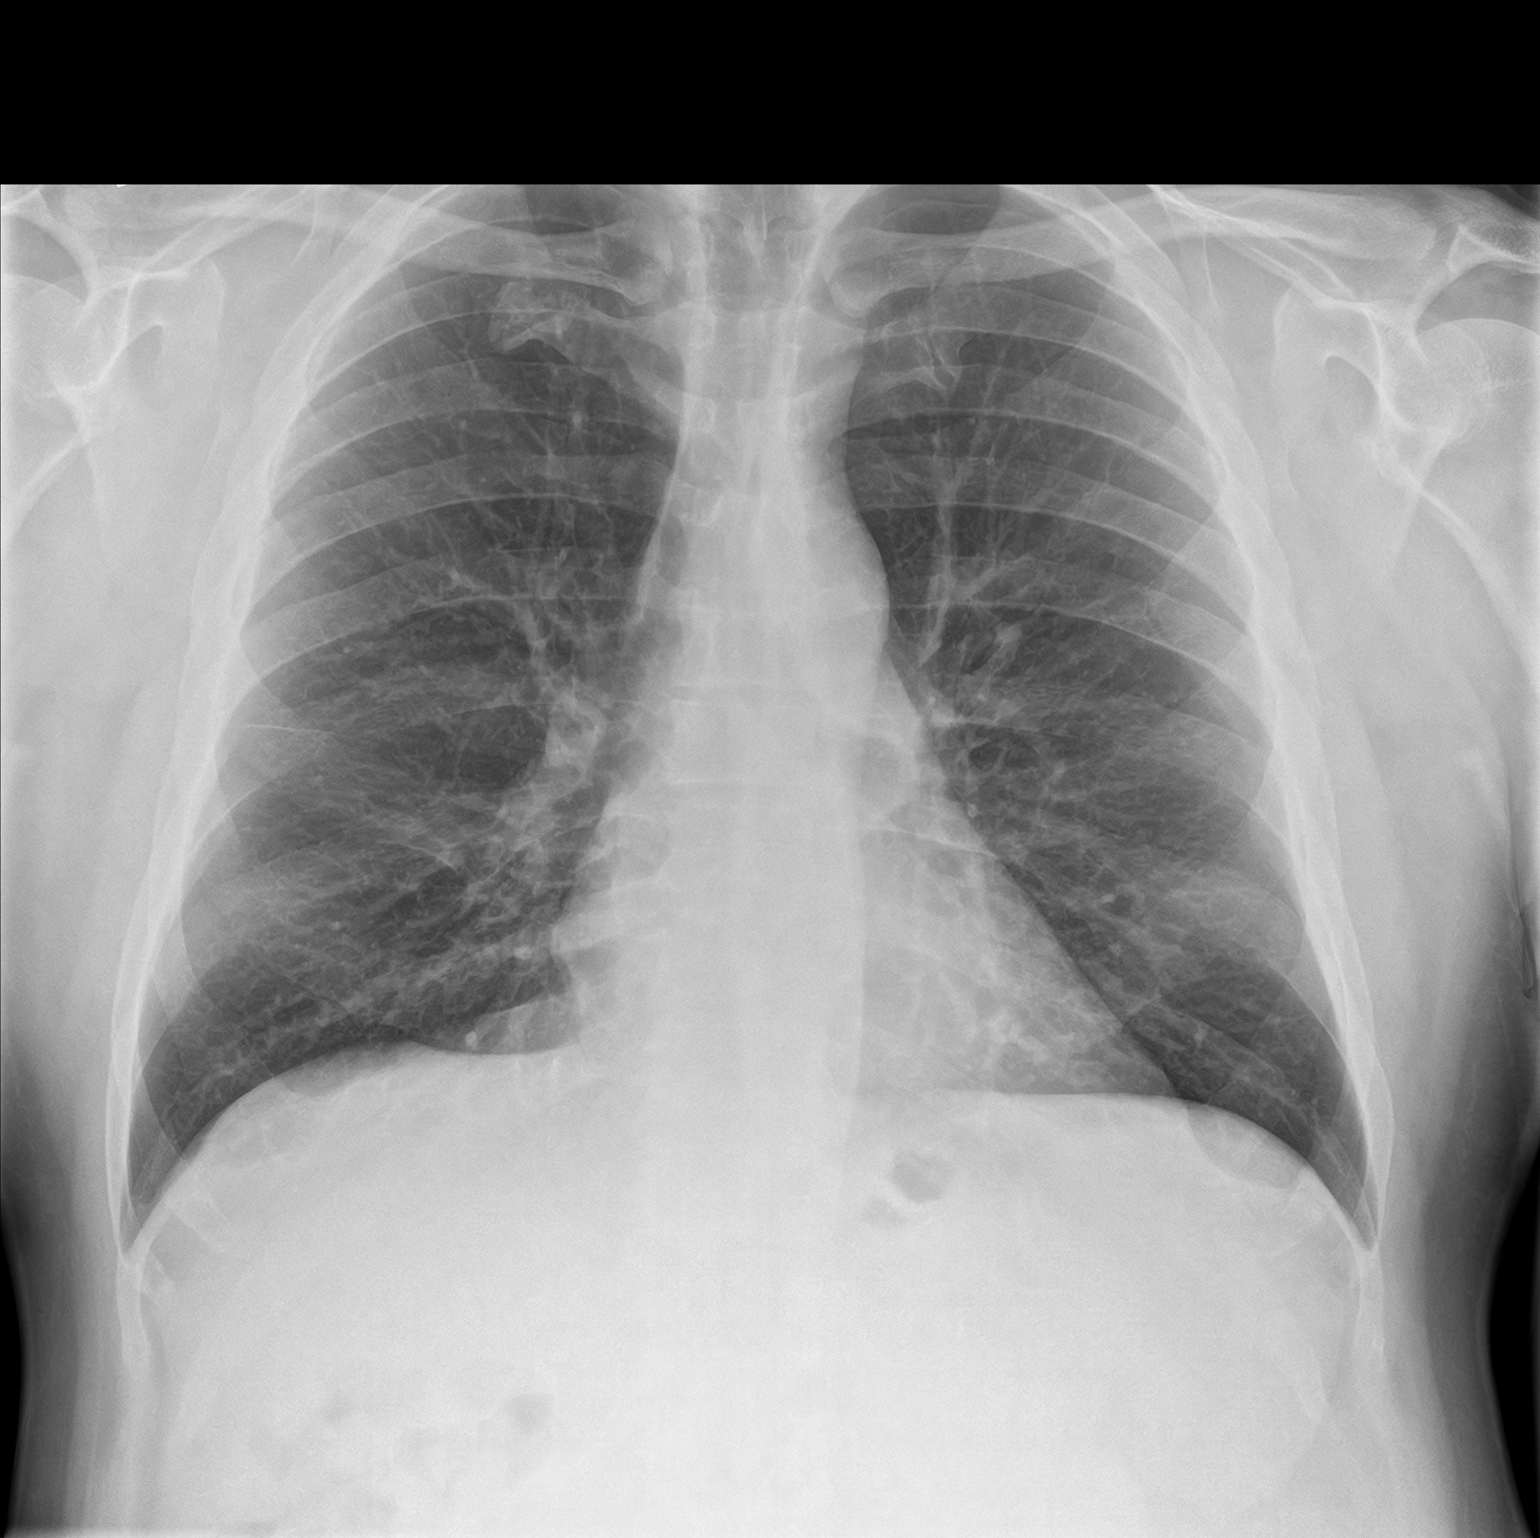
[im 2/2]
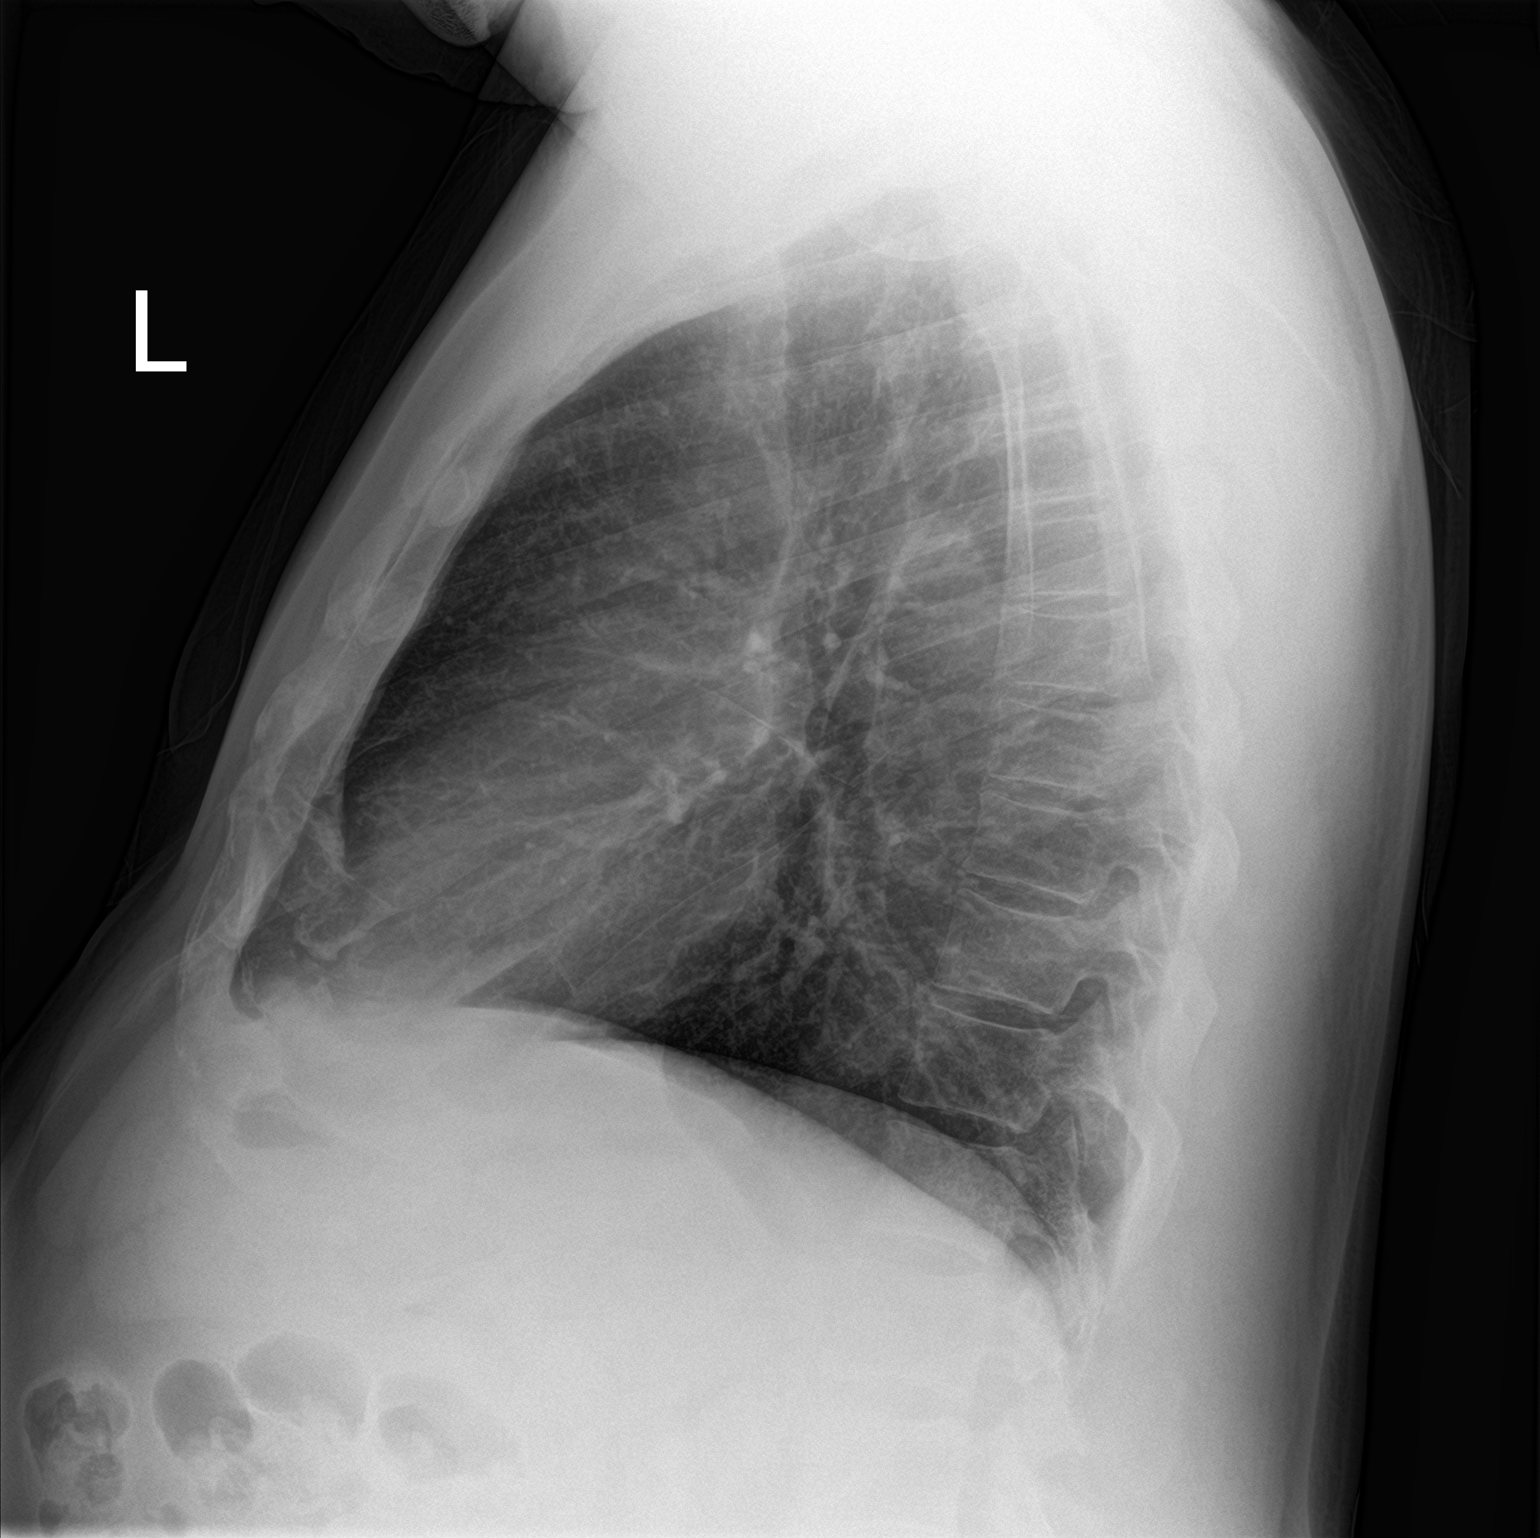

[2 of 2 positions shown; findings below may reference images not displayed]

FINDINGS: The heart size and mediastinal contours are within normal limits.
Both lungs are clear. The visualized skeletal structures are
unremarkable.
IMPRESSION: No active cardiopulmonary disease.

## 2021-04-26 ENCOUNTER — Ambulatory Visit
Admission: RE | Admit: 2021-04-26 | Discharge: 2021-04-26 | Disposition: A | Discharge status: Home / Self Care | Payer: Medicare Other | Source: Ambulatory Visit | Attending: Radiation Oncology | Admitting: Radiation Oncology

## 2021-04-26 ENCOUNTER — Encounter: Payer: Self-pay | Admitting: Radiation Oncology

## 2021-04-26 VITALS — BP 143/91 | HR 85 | Temp 98.7°F | Resp 20 | Wt 227.0 lb

## 2021-04-26 DIAGNOSIS — C61 Malignant neoplasm of prostate: Secondary | ICD-10-CM | POA: Diagnosis present

## 2021-04-26 DIAGNOSIS — R232 Flushing: Secondary | ICD-10-CM | POA: Diagnosis not present

## 2021-04-26 DIAGNOSIS — Z923 Personal history of irradiation: Secondary | ICD-10-CM | POA: Insufficient documentation

## 2021-04-26 DIAGNOSIS — N529 Male erectile dysfunction, unspecified: Secondary | ICD-10-CM | POA: Insufficient documentation

## 2021-04-26 NOTE — Progress Notes (Signed)
Radiation Oncology Follow up Note  Name: Chad Cross   Date:   04/26/2021 MRN:  [id number] DOB: [birth year]    This 64 y.o. male presents to the clinic today for 18-month follow-up status post IMRT to his prostate for Gleason 7 (3+4) adenocarcinoma presenting with a PSA of 8.8.  REFERRING PROVIDER: [name], *  HPI: Patient is a 65 year old male now out 16 months having completed IMRT radiation therapy along with ADT therapy for a Gleason 7 adenocarcinoma the prostate seen today in routine follow-up his major complaint still has hot flashes he is taking some vitamin D supplements.  He currently is on Eligard.  His most recent PSA is less than 0.1.  He also does have erectile dysfunction.  Specifically denies any increased lower urinary tract symptoms or diarrhea..  COMPLICATIONS OF TREATMENT: none  FOLLOW UP COMPLIANCE: keeps appointments   PHYSICAL EXAM:  BP (!) 143/91   Pulse 85   Temp 98.7 F (37.1 C)   Resp 20   Wt 227 lb (103 kg)   SpO2 100%   BMI 32.57 kg/m  Well-developed well-nourished patient in NAD. HEENT reveals PERLA, EOMI, discs not visualized.  Oral cavity is clear. No oral mucosal lesions are identified. Neck is clear without evidence of cervical or supraclavicular adenopathy. Lungs are clear to A&P. Cardiac examination is essentially unremarkable with regular rate and rhythm without murmur rub or thrill. Abdomen is benign with no organomegaly or masses noted. Motor sensory and DTR levels are equal and symmetric in the upper and lower extremities. Cranial nerves II through XII are grossly intact. Proprioception is intact. No peripheral adenopathy or edema is identified. No motor or sensory levels are noted. Crude visual fields are within normal range.  RADIOLOGY RESULTS: No current films for review  PLAN: Patient is currently under excellent biochemical control of his prostate cancer 16 months out from treatment.  And pleased with his overall progress.  I  have asked to see him back in 6 months for follow-up.  He continues under treatment with ADT therapy through urology.  Patient knows to call with any concerns.  I would like to take this opportunity to thank you for allowing me to participate in the care of your patient.Noreene Filbert, MD

## 2021-08-27 DIAGNOSIS — Z23 Encounter for immunization: Secondary | ICD-10-CM

## 2021-09-13 ENCOUNTER — Other Ambulatory Visit: Payer: Medicare Other

## 2021-09-13 ENCOUNTER — Telehealth: Payer: Self-pay

## 2021-09-13 ENCOUNTER — Other Ambulatory Visit: Payer: Self-pay

## 2021-09-13 DIAGNOSIS — C61 Malignant neoplasm of prostate: Secondary | ICD-10-CM

## 2021-09-13 NOTE — Telephone Encounter (Signed)
Prior Auth for Eligard approved 09/13/21 - 09/13/22.

## 2021-09-13 NOTE — Progress Notes (Signed)
09/14/21 4:15 PM   Chad Cross [birth year] [id number]  Referring provider:  [name], NP [address] [place],   [address] No chief complaint on file.    HPI: Chad Cross is a 65 y.o.male with a personal history of prostate cancer and erectile dysfunction, who presents today for 6 month follow-up for Eligard and PSA.   He underwent a prostate biopsy in 07/2019.  TRUS volume 46.6 g. Surgical pathology revealed  a total of 5 of 12 cores.  He had most of his disease at the left base involving 100% of Gleason 3+4 tissue as well as 93% of Gleason 5+4 at the left lateral and left base.  He had additional cortically sent 3+4, up for present the tissue at the left lateral mid as well as a single core of 3+3 at the left apex.   Patient underwent prostate gold seed marker placement on 09/10/2019 in anticipation of ADT w/ IMRT followed by Dr. Baruch Gouty.   His last Eligard injection was on 03/09/2021.   He is doing well today with no new urinary symptoms.   PSA trend:  Component Prostate Specific Ag, Serum  Latest Ref Rng & Units 0.0 - 4.0 ng/mL  06/13/2019 8.8 (H)  06/18/2019 10.5 (H)  03/03/2020 <0.1  08/31/2020 <0.1  03/08/2021 <0.1  09/13/2021 <0.1      PMH: Past Medical History:  Diagnosis Date   Anxiety 02/03/2016   Apnea, sleep 11/15/2007   Overview:  Has CPAP but is unable to tolerate the mask due to anxiety.    Blood dyscrasia    Cytopenia 02/03/2016   Elevated prostate specific antigen (PSA) 11/11/2015   HLD (hyperlipidemia) 02/03/2016   Hypertension    Prostate cancer ([place])    Subclinical hypothyroidism 11/11/2015    Surgical History: Past Surgical History:  Procedure Laterality Date   none      Home Medications:  Allergies as of 09/14/2021       Reactions   Lisinopril Cough        Medication List        Accurate as of September 14, 2021  4:15 PM. If you have any questions, ask your nurse or doctor.          STOP taking these  medications    sildenafil 20 MG tablet Commonly known as: REVATIO Stopped by: Hollice Espy, MD   sildenafil 25 MG tablet Commonly known as: VIAGRA Stopped by: Hollice Espy, MD   tamsulosin 0.4 MG Caps capsule Commonly known as: FLOMAX Stopped by: Hollice Espy, MD       TAKE these medications    levothyroxine 50 MCG tablet Commonly known as: SYNTHROID Take by mouth.   losartan 100 MG tablet Commonly known as: COZAAR Take 100 mg by mouth daily.   metoprolol succinate 50 MG 24 hr tablet Commonly known as: TOPROL-XL Take 50 mg by mouth daily.   rosuvastatin 10 MG tablet Commonly known as: CRESTOR Take 10 mg by mouth at bedtime.   simvastatin 20 MG tablet Commonly known as: ZOCOR Take 20 mg by mouth at bedtime.        Allergies:  Allergies  Allergen Reactions   Lisinopril Cough    Family History: Family History  Problem Relation Age of Onset   Prostate cancer Brother    Bladder Cancer Neg Hx    Kidney cancer Neg Hx     Social History:  reports that he has been smoking cigarettes. He has been smoking an average of  1 pack per day. He has never used smokeless tobacco. He reports current alcohol use. He reports that he does not use drugs.   Physical Exam: BP (!) 147/83   Pulse 97   Ht 5\' 10"  (1.778 m)   Wt 227 lb (103 kg)   BMI 32.57 kg/m   Constitutional:  Alert and oriented, No acute distress. HEENT: [place] AT, moist mucus membranes.  Trachea midline, no masses. Cardiovascular: No clubbing, cyanosis, or edema. Respiratory: Normal respiratory effort, no increased work of breathing. Skin: No rashes, bruises or suspicious lesions. Neurologic: Grossly intact, no focal deficits, moving all 4 extremities. Psychiatric: Normal mood and affect.  Laboratory Data:  Lab Results  Component Value Date   CREATININE 1.03 09/15/2019     Assessment & Plan:   Prostate cancer ([place]) - No evidence of disease, PSA remains undetectable  - Today will be his  last ADT, he has had a total of 2 to 3 years on ADT. We reviewed the plan to recheck PSA, and establish his nadir PSA number. He is agreeable to this plan   - Reviewed bone health recommendations - PSA; Future   Follow-up in 6 months for PSA   Conley Rolls as a scribe for Hollice Espy, MD.,have documented all relevant documentation on the behalf of Hollice Espy, MD,as directed by  Hollice Espy, MD while in the presence of Hollice Espy, MD. I have reviewed the above documentation for accuracy and completeness, and I agree with the above.   Hollice Espy, MD    [facility] [address], [place], [place] [address] [phone]

## 2021-09-14 ENCOUNTER — Encounter: Payer: Self-pay | Admitting: Urology

## 2021-09-14 ENCOUNTER — Ambulatory Visit: Payer: Medicare Other | Admitting: Urology

## 2021-09-14 VITALS — BP 147/83 | HR 97 | Ht 70.0 in | Wt 227.0 lb

## 2021-09-14 DIAGNOSIS — C61 Malignant neoplasm of prostate: Secondary | ICD-10-CM

## 2021-09-14 LAB — PSA: Prostate Specific Ag, Serum: <0.1 ng/mL (ref 0.0–4.0)

## 2021-09-14 MED ORDER — LEUPROLIDE ACETATE (6 MONTH) 45 MG ~~LOC~~ KIT
45.0000 mg | PACK | Freq: Once | SUBCUTANEOUS | Status: AC
  Administered 2021-09-14: 45 mg via SUBCUTANEOUS

## 2021-09-14 NOTE — Progress Notes (Signed)
Eligard SubQ Injection   Due to Prostate Cancer patient is present today for a Eligard Injection.  Medication: Eligard 6 month Dose: 45 mg  Location: right subcutaneous buttock Lot: [id number] Exp: 03/24  Patient tolerated well, no complications were noted  Performed by: Verlene Mayer, [place]  Per Dr. Erlene Quan patient is to not continue therapy this was patient's last injection.   This appointment was scheduled using wheel and given to patient today along with reminder continue on Vitamin D 800-1000iu and Calium 1000-1200mg  daily while on Androgen Deprivation Therapy.

## 2022-03-11 ENCOUNTER — Other Ambulatory Visit: Payer: Medicare Other

## 2022-03-11 DIAGNOSIS — C61 Malignant neoplasm of prostate: Secondary | ICD-10-CM

## 2022-03-12 LAB — PSA: Prostate Specific Ag, Serum: <0.1 ng/mL (ref 0.0–4.0)

## 2022-03-14 NOTE — Progress Notes (Signed)
? ?03/15/2022 ?1:22 PM  ? ?[name] ?1956-09-10 ?[id number] ? ?Referring provider:  ?[name], NP ?[address] ?[place],  [place] [address] ?Chief Complaint  ?Patient presents with  ? Prostate Cancer  ? ? ?HPI: ?Chad Cross is a 66 y.o.male with a personal history of prostate cancer and erectile dysfunction, who presents today for a 6 month follow-up with PSA.  ? ?He underwent a prostate biopsy in 07/2019. TRUS volume 46.6 g. Surgical pathology revealed  a total of 5 of 12 cores.  He had most of his disease at the left base involving 100% of Gleason 3+4 tissue as well as 93% of Gleason 5+4 at the left lateral and left base.  He had additional cortically sent 3+4, up for present the tissue at the left lateral mid as well as a single core of 3+3 at the left apex. ?  ?Patient underwent prostate gold seed marker placement on 09/10/2019 in anticipation of ADT w/ IMRT followed by Dr. Baruch Gouty.  ? ?His last Eligard injection was on 09/14/2021. ? ?His PSA remains undetectable on 03/11/2022. ? ?He is doing well today with no new urinary symptoms.  His libido is still quite low although his hot flashes are trying to subside. ? ?PMH: ?Past Medical History:  ?Diagnosis Date  ? Anxiety 02/03/2016  ? Apnea, sleep 11/15/2007  ? Overview:  Has CPAP but is unable to tolerate the mask due to anxiety.   ? Blood dyscrasia   ? Cytopenia 02/03/2016  ? Elevated prostate specific antigen (PSA) 11/11/2015  ? HLD (hyperlipidemia) 02/03/2016  ? Hypertension   ? Prostate cancer ([place])   ? Subclinical hypothyroidism 11/11/2015  ? ? ?Surgical History: ?Past Surgical History:  ?Procedure Laterality Date  ? none    ? ? ?Home Medications:  ?Allergies as of 03/15/2022   ? ?   Reactions  ? Lisinopril Cough  ? ?  ? ?  ?Medication List  ?  ? ?  ? Accurate as of Mar 15, 2022  1:22 PM. If you have any questions, ask your nurse or doctor.  ?  ?  ? ?  ? ?levothyroxine 75 MCG tablet ?Commonly known as: SYNTHROID ?PLEASE SEE ATTACHED FOR DETAILED  DIRECTIONS ?What changed: Another medication with the same name was removed. Continue taking this medication, and follow the directions you see here. ?  ?losartan 100 MG tablet ?Commonly known as: COZAAR ?Take 100 mg by mouth daily. ?  ?metoprolol succinate 50 MG 24 hr tablet ?Commonly known as: TOPROL-XL ?Take 50 mg by mouth daily. ?  ?rosuvastatin 10 MG tablet ?Commonly known as: CRESTOR ?Take 10 mg by mouth at bedtime. ?  ?simvastatin 20 MG tablet ?Commonly known as: ZOCOR ?Take 20 mg by mouth at bedtime. ?  ? ?  ? ? ?Allergies:  ?Allergies  ?Allergen Reactions  ? Lisinopril Cough  ? ? ?Family History: ?Family History  ?Problem Relation Age of Onset  ? Prostate cancer Brother   ? Bladder Cancer Neg Hx   ? Kidney cancer Neg Hx   ? ? ?Social History:  reports that he has been smoking cigarettes. He has been smoking an average of 1 pack per day. He has never used smokeless tobacco. He reports current alcohol use. He reports that he does not use drugs. ? ? ?Physical Exam: ?BP (!) 145/84   Pulse 89   Ht '5\' 10"'$  (1.778 m)   Wt 231 lb (104.8 kg)   BMI 33.15 kg/m?   ?Constitutional:  Alert and  oriented, No acute distress. ?HEENT: [place] AT, moist mucus membranes.  Trachea midline, no masses. ?Cardiovascular: No clubbing, cyanosis, or edema. ?Respiratory: Normal respiratory effort, no increased work of breathing. ?Skin: No rashes, bruises or suspicious lesions. ?Neurologic: Grossly intact, no focal deficits, moving all 4 extremities. ?Psychiatric: Normal mood and affect. ? ?Laboratory Data: ?Lab Results  ?Component Value Date  ? CREATININE 1.03 09/15/2019  ? ?Assessment & Plan:   ? ?Prostate cancer ([place])  ?- NED, PSA remains undetectable  ?- Continue bone health recommendations  ?- Discussed nadar PSA today  ? ?2. Erectile dysfunction and low libido  ?-Libido remains very poor this not interested in discussing erectile dysfunction today ?-Anticipate return of libido once ADT effects wane ?-If he is interested in pursuing  further treatment for erectile dysfunction once his desire returns, advised to call or message ? ? ?PSA only in 6 mo, PSA/ MD visit in 6 mo ? ?Conley Rolls as a scribe for Hollice Espy, MD.,have documented all relevant documentation on the behalf of Hollice Espy, MD,as directed by  Hollice Espy, MD while in the presence of Hollice Espy, MD. ? ?I have reviewed the above documentation for accuracy and completeness, and I agree with the above.  ? ?Hollice Espy, MD ? ? ?[facility] ?[address] ?[place], [place] [address] ?([phone] ?

## 2022-03-15 ENCOUNTER — Ambulatory Visit: Payer: Medicare Other | Admitting: Urology

## 2022-03-15 VITALS — BP 145/84 | HR 89 | Ht 70.0 in | Wt 231.0 lb

## 2022-03-15 DIAGNOSIS — Z8546 Personal history of malignant neoplasm of prostate: Secondary | ICD-10-CM | POA: Diagnosis not present

## 2022-03-15 DIAGNOSIS — C61 Malignant neoplasm of prostate: Secondary | ICD-10-CM

## 2022-03-15 DIAGNOSIS — R6882 Decreased libido: Secondary | ICD-10-CM | POA: Diagnosis not present

## 2022-03-15 DIAGNOSIS — N529 Male erectile dysfunction, unspecified: Secondary | ICD-10-CM | POA: Diagnosis not present

## 2022-04-25 ENCOUNTER — Encounter: Payer: Self-pay | Admitting: Radiation Oncology

## 2022-04-25 ENCOUNTER — Ambulatory Visit
Admission: RE | Admit: 2022-04-25 | Discharge: 2022-04-25 | Disposition: A | Discharge status: Home / Self Care | Payer: Medicare Other | Source: Ambulatory Visit | Attending: Radiation Oncology | Admitting: Radiation Oncology

## 2022-04-25 VITALS — BP 147/85 | HR 75 | Temp 98.6°F | Resp 16 | Ht 70.0 in | Wt 231.1 lb

## 2022-04-25 DIAGNOSIS — Z923 Personal history of irradiation: Secondary | ICD-10-CM | POA: Diagnosis not present

## 2022-04-25 DIAGNOSIS — C61 Malignant neoplasm of prostate: Secondary | ICD-10-CM | POA: Diagnosis present

## 2022-04-25 NOTE — Progress Notes (Signed)
Radiation Oncology Follow up Note  Name: Chad Cross   Date:   04/25/2022 MRN:  [id number] DOB: 1956-10-05    This 66 y.o. male presents to the clinic today for 2-1/2-year follow-up status post IMRT radiation therapy to his prostate for Gleason 7 (3+4) adenocarcinoma presenting with a PSA of 8.8.  REFERRING PROVIDER: [name], *  HPI: Patient is a 66 year old male now out over 2 and half years having completed IMRT radiation therapy to his prostate for Gleason 7 adenocarcinoma the prostate presenting with a PSA of 8.8.  Seen today in routine follow-up he is doing well.  He specifically denies any increased lower urinary tract symptoms diarrhea or fatigue.  His most recent PSA is.  Stable at less than 0.1  COMPLICATIONS OF TREATMENT: none  FOLLOW UP COMPLIANCE: keeps appointments   PHYSICAL EXAM:  BP (!) 147/85 (BP Location: Right Arm, Patient Position: Sitting, Cuff Size: Normal)   Pulse 75   Temp 98.6 F (37 C) (Tympanic)   Resp 16   Ht '5\' 10"'$  (1.778 m) Comment: stated ht  Wt 231 lb 1.6 oz (104.8 kg)   BMI 33.16 kg/m  Well-developed well-nourished patient in NAD. HEENT reveals PERLA, EOMI, discs not visualized.  Oral cavity is clear. No oral mucosal lesions are identified. Neck is clear without evidence of cervical or supraclavicular adenopathy. Lungs are clear to A&P. Cardiac examination is essentially unremarkable with regular rate and rhythm without murmur rub or thrill. Abdomen is benign with no organomegaly or masses noted. Motor sensory and DTR levels are equal and symmetric in the upper and lower extremities. Cranial nerves II through XII are grossly intact. Proprioception is intact. No peripheral adenopathy or edema is identified. No motor or sensory levels are noted. Crude visual fields are within normal range.  RADIOLOGY RESULTS: No current films for review.  PLAN: Present time patient is under excellent biochemical control of his prostate cancer after over 2  and half years.  I am pleased with his overall progress.  I asked to see him back in 1 year for follow-up.  Patient is to call with any concerns.  I would like to take this opportunity to thank you for allowing me to participate in the care of your patient.Noreene Filbert, MD

## 2022-08-26 DIAGNOSIS — Z23 Encounter for immunization: Secondary | ICD-10-CM | POA: Diagnosis not present

## 2022-09-15 ENCOUNTER — Other Ambulatory Visit: Payer: Medicare Other

## 2022-09-15 DIAGNOSIS — C61 Malignant neoplasm of prostate: Secondary | ICD-10-CM

## 2022-09-16 ENCOUNTER — Telehealth: Payer: Self-pay

## 2022-09-16 LAB — PSA: Prostate Specific Ag, Serum: <0.1 ng/mL (ref 0.0–4.0)

## 2022-09-16 NOTE — Telephone Encounter (Signed)
-----   Message from Hollice Espy, MD sent at 09/16/2022  8:21 AM EDT ----- PSA undetectable, great news.  Hollice Espy, MD

## 2022-09-16 NOTE — Telephone Encounter (Signed)
Informed patient of results

## 2023-03-14 ENCOUNTER — Other Ambulatory Visit: Payer: Medicare Other

## 2023-03-14 DIAGNOSIS — C61 Malignant neoplasm of prostate: Secondary | ICD-10-CM

## 2023-03-15 ENCOUNTER — Ambulatory Visit: Payer: Medicare Other | Admitting: Urology

## 2023-03-15 LAB — PSA: Prostate Specific Ag, Serum: 0.1 ng/mL (ref 0.0–4.0)

## 2023-03-21 ENCOUNTER — Ambulatory Visit: Payer: Medicare Other | Admitting: Urology

## 2023-03-21 VITALS — BP 137/83 | HR 79 | Ht 70.0 in | Wt 225.2 lb

## 2023-03-21 DIAGNOSIS — R6882 Decreased libido: Secondary | ICD-10-CM

## 2023-03-21 DIAGNOSIS — C61 Malignant neoplasm of prostate: Secondary | ICD-10-CM

## 2023-03-21 NOTE — Progress Notes (Signed)
I, Amy L Pierron, acting as a scribe for Vanna Scotland, MD.,have documented all relevant documentation on the behalf of Vanna Scotland, MD,as directed by  Vanna Scotland, MD while in the presence of Vanna Scotland, MD.  03/21/2023 11:03 AM   Chad Cross [birth year] [id number]  Referring provider: Myrene Buddy, NP [address] [place],  [place] [address]  Chief Complaint  Patient presents with   Prostate Cancer    Follow up    HPI: 67 year-old male with a personal history of prostate cancer presents today for a follow-up.  He underwent a prostate biopsy in 07/2019. TRUS volume 46.6 g. Surgical pathology revealed  a total of 5 of 12 cores.  He had most of his disease at the left base involving 100% of Gleason 3+4 tissue as well as 93% of Gleason 5+4 at the left lateral and left base.  He had additional cortically sent 3+4, up for present the tissue at the left lateral mid as well as a single core of 3+3 at the left apex.   Patient underwent prostate gold seed marker placement on 09/10/2019 in anticipation of ADT w/ IMRT followed by Dr. Rushie Chestnut. His last Eligard injection was on 09/14/2021.  His most recent PSA on 03/14/2023 has risen to 0.1. On 09/15/22 it was undetectable.  He still has some hot flashes but no urinary complaints. He still has very low libido.   PMH: Past Medical History:  Diagnosis Date   Anxiety 02/03/2016   Apnea, sleep 11/15/2007   Overview:  Has CPAP but is unable to tolerate the mask due to anxiety.    Blood dyscrasia    Cytopenia 02/03/2016   Elevated prostate specific antigen (PSA) 11/11/2015   HLD (hyperlipidemia) 02/03/2016   Hypertension    Prostate cancer ([facility])    Subclinical hypothyroidism 11/11/2015    Surgical History: Past Surgical History:  Procedure Laterality Date   none      Home Medications:  Allergies as of 03/21/2023       Reactions   Lisinopril Cough        Medication List        Accurate as of Mar 21, 2023 11:03 AM.  If you have any questions, ask your nurse or doctor.          levothyroxine 75 MCG tablet Commonly known as: SYNTHROID Take 75 mcg by mouth daily before breakfast.   losartan 100 MG tablet Commonly known as: COZAAR Take 100 mg by mouth daily.   metoprolol succinate 50 MG 24 hr tablet Commonly known as: TOPROL-XL Take 50 mg by mouth daily.   rosuvastatin 10 MG tablet Commonly known as: CRESTOR Take 10 mg by mouth at bedtime.   simvastatin 20 MG tablet Commonly known as: ZOCOR Take 20 mg by mouth at bedtime.        Allergies:  Allergies  Allergen Reactions   Lisinopril Cough    Family History: Family History  Problem Relation Age of Onset   Prostate cancer Brother    Bladder Cancer Neg Hx    Kidney cancer Neg Hx     Social History:  reports that he has been smoking cigarettes. He has been smoking an average of 1 pack per day. He has never used smokeless tobacco. He reports current alcohol use. He reports that he does not use drugs.   Physical Exam: BP 137/83   Pulse 79   Ht 5\' 10"  (1.778 m)   Wt 225 lb 4 oz (102.2 kg)  BMI 32.32 kg/m   Constitutional:  Alert and oriented, No acute distress. HEENT: [place] AT, moist mucus membranes.  Trachea midline, no masses. Neurologic: Grossly intact, no focal deficits, moving all 4 extremities. Psychiatric: Normal mood and affect.  Assessment & Plan:    Prostate cancer  - PSA has slightly risen to 0.1. Will likely continue to rise until it approaches his Nadir.   - Continue to check PSA q6 month basis.   2. Decreased libido  - Side effect of ADT  - Given that his PSA is starting to rise and hot flashes are subsiding, it's anticipated his libido will return. He is not interested in addressing the erectile dysfunction at this time until it returns. He will let us know in the interim if he wants to address it sooner.   Return in about 6 months (around 09/21/2023) for PSA only, 1 year PSA and visit.  I have reviewed  the above documentation for accuracy and completeness, and I agree with the above.   Vanna Scotland, MD    [facility] [address] [place], [place] [address] [phone]

## 2023-04-21 ENCOUNTER — Inpatient Hospital Stay: Payer: Medicare Other

## 2023-04-27 ENCOUNTER — Ambulatory Visit: Payer: Medicare Other | Admitting: Radiation Oncology

## 2023-04-28 ENCOUNTER — Ambulatory Visit: Payer: Medicare Other | Admitting: Radiation Oncology

## 2023-08-18 DIAGNOSIS — Z23 Encounter for immunization: Secondary | ICD-10-CM | POA: Diagnosis not present

## 2023-09-21 ENCOUNTER — Other Ambulatory Visit: Payer: Medicare Other

## 2023-09-26 ENCOUNTER — Other Ambulatory Visit: Payer: Medicare Other

## 2023-09-26 DIAGNOSIS — C61 Malignant neoplasm of prostate: Secondary | ICD-10-CM

## 2023-09-27 LAB — PSA: Prostate Specific Ag, Serum: 0.3 ng/mL (ref 0.0–4.0)

## 2023-12-12 ENCOUNTER — Inpatient Hospital Stay: Payer: Medicare Other | Attending: Oncology | Admitting: Oncology

## 2023-12-12 ENCOUNTER — Encounter: Payer: Self-pay | Admitting: Oncology

## 2023-12-12 ENCOUNTER — Inpatient Hospital Stay: Payer: Medicare Other

## 2023-12-12 VITALS — BP 162/78 | HR 67 | Temp 98.8°F | Resp 18 | Ht 70.0 in | Wt 216.6 lb

## 2023-12-12 DIAGNOSIS — F17210 Nicotine dependence, cigarettes, uncomplicated: Secondary | ICD-10-CM | POA: Insufficient documentation

## 2023-12-12 DIAGNOSIS — D696 Thrombocytopenia, unspecified: Secondary | ICD-10-CM | POA: Diagnosis present

## 2023-12-12 DIAGNOSIS — Z8546 Personal history of malignant neoplasm of prostate: Secondary | ICD-10-CM | POA: Diagnosis not present

## 2023-12-12 DIAGNOSIS — C61 Malignant neoplasm of prostate: Secondary | ICD-10-CM

## 2023-12-12 LAB — CBC WITH DIFFERENTIAL/PLATELET
Abs Immature Granulocytes: 0.04 10*3/uL (ref 0.00–0.07)
Basophils Absolute: 0 10*3/uL (ref 0.0–0.1)
Basophils Relative: 1 %
Eosinophils Absolute: 0 10*3/uL (ref 0.0–0.5)
Eosinophils Relative: 1 %
HCT: 44.6 % (ref 39.0–52.0)
Hemoglobin: 15.1 g/dL (ref 13.0–17.0)
Immature Granulocytes: 1 %
Lymphocytes Relative: 28 %
Lymphs Abs: 0.9 10*3/uL (ref 0.7–4.0)
MCH: 30.3 pg (ref 26.0–34.0)
MCHC: 33.9 g/dL (ref 30.0–36.0)
MCV: 89.6 fL (ref 80.0–100.0)
Monocytes Absolute: 0.3 10*3/uL (ref 0.1–1.0)
Monocytes Relative: 11 %
Neutro Abs: 1.8 10*3/uL (ref 1.7–7.7)
Neutrophils Relative %: 58 %
Platelets: 95 10*3/uL — ABNORMAL LOW (ref 150–400)
RBC: 4.98 MIL/uL (ref 4.22–5.81)
RDW: 12 % (ref 11.5–15.5)
WBC: 3.1 10*3/uL — ABNORMAL LOW (ref 4.0–10.5)
nRBC: 0 % (ref 0.0–0.2)

## 2023-12-12 LAB — HEPATITIS C ANTIBODY: HCV Ab: NONREACTIVE

## 2023-12-12 LAB — FOLATE: Folate: 13.1 ng/mL (ref >5.9)

## 2023-12-12 LAB — HIV ANTIBODY (ROUTINE TESTING W REFLEX): HIV Screen 4th Generation wRfx: NONREACTIVE

## 2023-12-12 LAB — VITAMIN B12: Vitamin B-12: 387 pg/mL (ref 180–914)

## 2023-12-13 LAB — PSA: Prostatic Specific Antigen: 0.33 ng/mL (ref 0.00–4.00)

## 2023-12-17 ENCOUNTER — Encounter: Payer: Self-pay | Admitting: Oncology

## 2023-12-17 NOTE — Progress Notes (Signed)
Hematology/Oncology Consult note [facility] Telephone:([phone] [phone]  Patient Care Team: Myrene Buddy, NP as PCP - General (Internal Medicine) Carmina Miller, MD as Referring Physician (Radiation Oncology) Vanna Scotland, MD as Consulting Physician (Urology)   Name of the patient: Chad Cross  [id number]  [birth year]    Reason for referral-thrombocytopenia   Referring physician-Sarah Bayard Males, NP  Date of visit: 12/17/23   History of presenting illness- Patient is a 68 year old male with a past medical history significant for hypertension hyperlipidemia and hypothyroidism referred for thrombocytopenia.  CBC from 11/28/2023 showed white cell count of 3.2, H&H of 14.9/44.4 and a platelet count of 87.  Looking back at his prior platelet counts he has had chronic thrombocytopenia at least dating back to 2016 when his platelet counts were 124.  Between 2020 and 2023 his platelet counts have been in the 100s.  Hemoglobin has been stable between 13-14.  White cell count mostly fluctuates between 3-4.  He has no known history of liver disease in his liver function tests have been normal.  Patient denies any symptoms of bleeding or bruising.  Denies any over-the-counter medications or herbal supplements.  ECOG PS- 0  Pain scale- 0   Review of systems- Review of Systems  Constitutional:  Negative for chills, fever, malaise/fatigue and weight loss.  HENT:  Negative for congestion, ear discharge and nosebleeds.   Eyes:  Negative for blurred vision.  Respiratory:  Negative for cough, hemoptysis, sputum production, shortness of breath and wheezing.   Cardiovascular:  Negative for chest pain, palpitations, orthopnea and claudication.  Gastrointestinal:  Negative for abdominal pain, blood in stool, constipation, diarrhea, heartburn, melena, nausea and vomiting.  Genitourinary:  Negative for dysuria, flank pain, frequency, hematuria and urgency.   Musculoskeletal:  Negative for back pain, joint pain and myalgias.  Skin:  Negative for rash.  Neurological:  Negative for dizziness, tingling, focal weakness, seizures, weakness and headaches.  Endo/Heme/Allergies:  Does not bruise/bleed easily.  Psychiatric/Behavioral:  Negative for depression and suicidal ideas. The patient does not have insomnia.     Allergies  Allergen Reactions   Lisinopril Cough    Patient Active Problem List   Diagnosis Date Noted   Obesity (BMI 30-39.9) 12/09/2019   HTN (hypertension) 09/12/2019   Prostate cancer (HCC) 09/12/2019   Sepsis (HCC) 09/12/2019   UTI (urinary tract infection) 09/12/2019   Aortic atherosclerosis (HCC) 06/27/2019   Anxiety 02/03/2016   Cytopenia 02/03/2016   HLD (hyperlipidemia) 02/03/2016   Elevated prostate specific antigen (PSA) 11/11/2015   Hypothyroidism 11/11/2015   OSA (obstructive sleep apnea) 11/15/2007     Past Medical History:  Diagnosis Date   Anxiety 02/03/2016   Apnea, sleep 11/15/2007   Overview:  Has CPAP but is unable to tolerate the mask due to anxiety.    Blood dyscrasia    Cytopenia 02/03/2016   Elevated prostate specific antigen (PSA) 11/11/2015   HLD (hyperlipidemia) 02/03/2016   Hypertension    Prostate cancer (HCC)    Subclinical hypothyroidism 11/11/2015     Past Surgical History:  Procedure Laterality Date   none      Social History   Socioeconomic History   Marital status: Married    Spouse name: Jahking Lesser   Number of children: Not on file   Years of education: Not on file   Highest education level: Not on file  Occupational History   Occupation: Mining engineer For [employer]  Tobacco Use   Smoking status: Every Day  Current packs/day: 1.00    Types: Cigarettes   Smokeless tobacco: Never  Vaping Use   Vaping status: Never Used  Substance and Sexual Activity   Alcohol use: Yes    Comment: social drinker   Drug use: No   Sexual activity: Yes    Birth  control/protection: None  Other Topics Concern   Not on file  Social History Narrative   Patient loves spending time with his wife, son, and grand-daughter! He also lost a son due to a bad case of diabetes whom he keeps near & dear to his heart!   Social Drivers of Corporate investment banker Strain: Low Risk  (05/23/2023)   Received from [facility]   Overall Financial Resource Strain (CARDIA)    Difficulty of Paying Living Expenses: Not very hard  Food Insecurity: No Food Insecurity (12/12/2023)   Hunger Vital Sign    Worried About Running Out of Food in the Last Year: Never true    Ran Out of Food in the Last Year: Never true  Transportation Needs: No Transportation Needs (05/23/2023)   Received from [facility]   PRAPARE - Transportation    Lack of Transportation (Non-Medical): No    In the past 12 months, has lack of transportation kept you from medical appointments or from getting medications?: No  Physical Activity: Insufficiently Active (09/13/2019)   Exercise Vital Sign    Days of Exercise per Week: 3 days    Minutes of Exercise per Session: 20 min  Stress: No Stress Concern Present (09/13/2019)   [employer] of Occupational Health - Occupational Stress Questionnaire    Feeling of Stress : Only a little  Social Connections: Socially Integrated (09/13/2019)   Social Connection and Isolation Panel [NHANES]    Frequency of Communication with Friends and Family: More than three times a week    Frequency of Social Gatherings with Friends and Family: More than three times a week    Attends Religious Services: More than 4 times per year    Active Member of [employer] or Organizations: Yes    Attends Engineer, structural: More than 4 times per year    Marital Status: Married  Catering manager Violence: Not At Risk (12/12/2023)   Humiliation, Afraid, Rape, and Kick questionnaire    Fear of Current or Ex-Partner: No    Emotionally Abused:  No    Physically Abused: No    Sexually Abused: No     Family History  Problem Relation Age of Onset   Prostate cancer Brother 70 - 60       Currently in remission.   Diabetes Child    Bladder Cancer Neg Hx    Kidney cancer Neg Hx      Current Outpatient Medications:    levothyroxine (SYNTHROID) 75 MCG tablet, Take 75 mcg by mouth daily before breakfast., Disp: , Rfl:    losartan (COZAAR) 100 MG tablet, Take 100 mg by mouth daily., Disp: , Rfl:    metoprolol succinate (TOPROL-XL) 50 MG 24 hr tablet, Take 50 mg by mouth daily., Disp: , Rfl:    rosuvastatin (CRESTOR) 10 MG tablet, Take 10 mg by mouth at bedtime., Disp: , Rfl:    simvastatin (ZOCOR) 20 MG tablet, Take 20 mg by mouth at bedtime. (Patient not taking: Reported on 12/12/2023), Disp: , Rfl:    Physical exam:  Vitals:   12/12/23 1057  BP: (!) 162/78  Pulse: 67  Resp: 18  Temp:  98.8 F (37.1 C)  TempSrc: Tympanic  SpO2: 100%  Weight: 216 lb 9.6 oz (98.2 kg)  Height: 5\' 10"  (1.778 m)   Physical Exam Cardiovascular:     Rate and Rhythm: Normal rate and regular rhythm.     Heart sounds: Normal heart sounds.  Pulmonary:     Effort: Pulmonary effort is normal.     Breath sounds: Normal breath sounds.  Abdominal:     General: Bowel sounds are normal.     Palpations: Abdomen is soft.     Comments: No palpable hepatosplenomegaly  Skin:    General: Skin is warm and dry.  Neurological:     Mental Status: He is alert and oriented to person, place, and time.           Latest Ref Rng & Units 09/15/2019    5:06 AM  CMP  Glucose 70 - 99 mg/dL 865   BUN 8 - 23 mg/dL 14   Creatinine 7.84 - 1.24 mg/dL 6.96   Sodium 295 - 284 mmol/L 139   Potassium 3.5 - 5.1 mmol/L 4.1   Chloride 98 - 111 mmol/L 107   CO2 22 - 32 mmol/L 26   Calcium 8.9 - 10.3 mg/dL 9.0       Latest Ref Rng & Units 12/12/2023   11:39 AM  CBC  WBC 4.0 - 10.5 K/uL 3.1   Hemoglobin 13.0 - 17.0 g/dL 13.2   Hematocrit 44.0 - 52.0 % 44.6    Platelets 150 - 400 K/uL 95    Assessment and plan- Patient is a 68 y.o. male furred for thrombocytopenia  Given that the patient has had chronic thrombocytopenia at least dating back to 2016 and his platelet counts have mostly remained in the 160s seems like a component of ITP.  Patient does not have any known liver disease.  He has no significant alcohol history.  Today I will check CBC with differential, B12 folate HIV and hepatitis C testing.  I will see him back in 2 weeks time to discuss the results of blood work and further management   Thank you for this kind referral and the opportunity to participate in the care of this patient   Visit Diagnosis 1. Thrombocytopenia (HCC)     Dr. Owens Shark, MD, MPH [facility] [phone] 12/17/2023

## 2024-01-09 ENCOUNTER — Inpatient Hospital Stay: Payer: Medicare Other | Attending: Oncology | Admitting: Oncology

## 2024-01-09 ENCOUNTER — Encounter: Payer: Self-pay | Admitting: Oncology

## 2024-01-09 VITALS — BP 132/87 | HR 82 | Temp 98.9°F | Resp 18 | Wt 225.7 lb

## 2024-01-09 DIAGNOSIS — D696 Thrombocytopenia, unspecified: Secondary | ICD-10-CM | POA: Diagnosis not present

## 2024-01-09 DIAGNOSIS — Z8042 Family history of malignant neoplasm of prostate: Secondary | ICD-10-CM | POA: Diagnosis not present

## 2024-01-09 DIAGNOSIS — D708 Other neutropenia: Secondary | ICD-10-CM | POA: Insufficient documentation

## 2024-01-09 DIAGNOSIS — F17210 Nicotine dependence, cigarettes, uncomplicated: Secondary | ICD-10-CM | POA: Insufficient documentation

## 2024-01-10 NOTE — Progress Notes (Signed)
 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 CBC from 11/28/2023 showed white cell count of 3.2, H&H of 14.9/44.4 and a platelet count of 87. Looking back at his prior platelet counts he has had chronic thrombocytopenia at least dating back to 2016 when his platelet counts were 124. Between 2020 and 2023 his platelet counts have been in the 100s. Hemoglobin has been stable between 13-14. White cell count mostly fluctuates between 3-4. He has no known history of liver disease in his liver function tests have been normal. Patient denies any symptoms of bleeding or bruising. Denies any over-the-counter medications or herbal supplements.   Labs from 12/12/2023 were as follows: CBC showed white cell count of 3.1 with an ANC of 1.8 which was similar to blood work from November 2020.  H&H normal at 15.1/44.6.  Platelets 95 again similar to what he had back in November 2020.  B12 and folate within normal limits and HIV and hepatitis C testing negative  Interval history-no acute issues since last visit.  Patient is doing well overall  ECOG PS- 0 Pain scale- 0   Review of systems- Review of Systems  Constitutional:  Negative for chills, fever,  malaise/fatigue and weight loss.  HENT:  Negative for congestion, ear discharge and nosebleeds.   Eyes:  Negative for blurred vision.  Respiratory:  Negative for cough, hemoptysis, sputum production, shortness of breath and wheezing.   Cardiovascular:  Negative for chest pain, palpitations, orthopnea and claudication.  Gastrointestinal:  Negative for abdominal pain, blood in stool, constipation, diarrhea, heartburn, melena, nausea and vomiting.  Genitourinary:  Negative for dysuria, flank pain, frequency, hematuria and urgency.  Musculoskeletal:  Negative for back pain, joint pain and myalgias.  Skin:  Negative for rash.  Neurological:  Negative for dizziness, tingling, focal weakness, seizures, weakness and headaches.  Endo/Heme/Allergies:  Does not bruise/bleed easily.  Psychiatric/Behavioral:  Negative for depression and suicidal ideas. The patient does not have insomnia.       Allergies  Allergen Reactions   Lisinopril Cough     Past Medical History:  Diagnosis Date   Anxiety 02/03/2016   Apnea, sleep 11/15/2007   Overview:  Has CPAP but is unable to tolerate the mask due to anxiety.    Blood dyscrasia    Cytopenia 02/03/2016   Elevated prostate specific antigen (PSA) 11/11/2015   HLD (hyperlipidemia) 02/03/2016   Hypertension    Prostate cancer (HCC)    Subclinical hypothyroidism 11/11/2015     Past Surgical History:  Procedure Laterality Date   none      Social History   Socioeconomic History   Marital status: Married    Spouse name: Akshaj Besancon   Number of children: Not on file   Years of education: Not on file   Highest education level: Not on file  Occupational History   Occupation: Mining engineer For [employer]  Tobacco Use  Smoking status: Every Day    Current packs/day: 1.00    Types: Cigarettes   Smokeless tobacco: Never  Vaping Use   Vaping status: Never Used  Substance and Sexual Activity   Alcohol use: Yes    Comment: social drinker    Drug use: No   Sexual activity: Yes    Birth control/protection: None  Other Topics Concern   Not on file  Social History Narrative   Patient loves spending time with his wife, son, and grand-daughter! He also lost a son due to a bad case of diabetes whom he keeps near & dear to his heart!   Social Drivers of Corporate investment banker Strain: Low Risk  (05/23/2023)   Received from [facility]   Overall Financial Resource Strain (CARDIA)    Difficulty of Paying Living Expenses: Not very hard  Food Insecurity: No Food Insecurity (12/12/2023)   Hunger Vital Sign    Worried About Running Out of Food in the Last Year: Never true    Ran Out of Food in the Last Year: Never true  Transportation Needs: No Transportation Needs (05/23/2023)   Received from [facility]   PRAPARE - Transportation    Lack of Transportation (Non-Medical): No    In the past 12 months, has lack of transportation kept you from medical appointments or from getting medications?: No  Physical Activity: Insufficiently Active (09/13/2019)   Exercise Vital Sign    Days of Exercise per Week: 3 days    Minutes of Exercise per Session: 20 min  Stress: No Stress Concern Present (09/13/2019)   [employer] of Occupational Health - Occupational Stress Questionnaire    Feeling of Stress : Only a little  Social Connections: Socially Integrated (09/13/2019)   Social Connection and Isolation Panel [NHANES]    Frequency of Communication with Friends and Family: More than three times a week    Frequency of Social Gatherings with Friends and Family: More than three times a week    Attends Religious Services: More than 4 times per year    Active Member of [employer] or Organizations: Yes    Attends Engineer, structural: More than 4 times per year    Marital Status: Married  Catering manager Violence: Not At Risk (12/12/2023)   Humiliation, Afraid, Rape, and Kick questionnaire    Fear of  Current or Ex-Partner: No    Emotionally Abused: No    Physically Abused: No    Sexually Abused: No    Family History  Problem Relation Age of Onset   Prostate cancer Brother 28 - 73       Currently in remission.   Diabetes Child    Bladder Cancer Neg Hx    Kidney cancer Neg Hx      Current Outpatient Medications:    levothyroxine (SYNTHROID) 75 MCG tablet, Take 75 mcg by mouth daily before breakfast., Disp: , Rfl:    losartan (COZAAR) 100 MG tablet, Take 100 mg by mouth daily., Disp: , Rfl:    metoprolol succinate (TOPROL-XL) 50 MG 24 hr tablet, Take 50 mg by mouth daily., Disp: , Rfl:    rosuvastatin (CRESTOR) 10 MG tablet, Take 10 mg by mouth at bedtime., Disp: , Rfl:    simvastatin (ZOCOR) 20 MG tablet, Take 20 mg by mouth at bedtime. (Patient not taking: Reported on 12/12/2023), Disp: , Rfl:   Physical exam:  Vitals:   01/09/24 1527  BP: 132/87  Pulse: 82  Resp: 18  Temp: 98.9 F (37.2 C)  TempSrc: Temporal  SpO2: 100%  Weight: 225 lb 11.2 oz (102.4 kg)   Physical Exam Cardiovascular:     Rate and Rhythm: Normal rate and regular rhythm.     Heart sounds: Normal heart sounds.  Pulmonary:     Effort: Pulmonary effort is normal.     Breath sounds: Normal breath sounds.  Skin:    General: Skin is warm and dry.  Neurological:     Mental Status: He is alert and oriented to person, place, and time.         Latest Ref Rng & Units 09/15/2019    5:06 AM  CMP  Glucose 70 - 99 mg/dL 161   BUN 8 - 23 mg/dL 14   Creatinine 0.96 - 1.24 mg/dL 0.45   Sodium 409 - 811 mmol/L 139   Potassium 3.5 - 5.1 mmol/L 4.1   Chloride 98 - 111 mmol/L 107   CO2 22 - 32 mmol/L 26   Calcium 8.9 - 10.3 mg/dL 9.0       Latest Ref Rng & Units 12/12/2023   11:39 AM  CBC  WBC 4.0 - 10.5 K/uL 3.1   Hemoglobin 13.0 - 17.0 g/dL 91.4   Hematocrit 78.2 - 52.0 % 44.6   Platelets 150 - 400 K/uL 95      Assessment and plan- Patient is a 68 y.o. male here for follow up of thrombocytopenia  and neutropenia  Both leukopenia/Neutropenia and thrombocytopenia are chronic with counts which looks similar even back in November 2020.  Neutrophils have remained 1.5 and patient has not had any recurrent infections.  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  Given the chronic thrombocytopenia-this could be autoimmune/ITP.  Thrombocytopenia is presently mild and does not require any treatment at this time or further investigation such as bone marrow biopsy.  Treatment can be considered if platelet counts drop to less than 30.  CBC with differential in 6 months in 1 year and I will see him back in 1 year   Visit Diagnosis 1. Thrombocytopenia (HCC)   2. Other neutropenia (HCC)      Dr. Owens Shark, MD, MPH [facility] at [facility] [phone] 01/10/2024 8:38 AM

## 2024-03-14 ENCOUNTER — Other Ambulatory Visit: Payer: Self-pay

## 2024-03-14 DIAGNOSIS — C61 Malignant neoplasm of prostate: Secondary | ICD-10-CM

## 2024-03-15 LAB — PSA: Prostate Specific Ag, Serum: 0.3 ng/mL (ref 0.0–4.0)

## 2024-03-26 ENCOUNTER — Ambulatory Visit (INDEPENDENT_AMBULATORY_CARE_PROVIDER_SITE_OTHER): Payer: Self-pay | Admitting: Urology

## 2024-03-26 VITALS — BP 144/88 | HR 80 | Ht 70.0 in | Wt 231.0 lb

## 2024-03-26 DIAGNOSIS — R6882 Decreased libido: Secondary | ICD-10-CM

## 2024-03-26 DIAGNOSIS — C61 Malignant neoplasm of prostate: Secondary | ICD-10-CM | POA: Diagnosis not present

## 2024-03-26 NOTE — Progress Notes (Signed)
 I,Amy L Pierron,acting as a scribe for Dustin Gimenez, MD.,have documented all relevant documentation on the behalf of Dustin Gimenez, MD,as directed by  Dustin Gimenez, MD while in the presence of Dustin Gimenez, MD.  03/26/2024 3:03 PM   Chad Cross 1956-02-27 [id number]  Referring provider: Deliah Fells, NP [address] [place],  [place] [address]  Chief Complaint  Patient presents with   Prostate Cancer    HPI: 68 year-old with a personal history of prostate cancer presents today for a follow-up.   He underwent a prostate biopsy in 07/2019. TRUS volume 46.6 g. Surgical pathology revealed  a total of 5 of 12 cores.  He had most of his disease at the left base involving 100% of Gleason 3+4 tissue as well as 93% of Gleason 5+4 at the left lateral and left base.  He had additional cortically sent 3+4, up for present the tissue at the left lateral mid as well as a single core of 3+3 at the left apex.   Patient underwent prostate gold seed marker placement on 09/10/2019 in anticipation of ADT w/ IMRT followed by Dr. Jacalyn Martin. His last Eligard  injection was on 09/14/2021.  His PSA levels have stabilized at 0.3 as of 03/14/2024, which is considered his new nadir.   He reports low energy levels and decreased libido, which he attributes to his 'new norm' post-treatment.   He denies any new urinary symptoms such as burning, blood in urine, or frequent nighttime urination.   He acknowledges a decrease in exercise due to work commitments, which involve driving all day, leading to fatigue.   PMH: Past Medical History:  Diagnosis Date   Anxiety 02/03/2016   Apnea, sleep 11/15/2007   Overview:  Has CPAP but is unable to tolerate the mask due to anxiety.    Blood dyscrasia    Cytopenia 02/03/2016   Elevated prostate specific antigen (PSA) 11/11/2015   HLD (hyperlipidemia) 02/03/2016   Hypertension    Prostate cancer (HCC)    Subclinical hypothyroidism 11/11/2015    Surgical  History: Past Surgical History:  Procedure Laterality Date   none      Home Medications:  Allergies as of 03/26/2024       Reactions   Lisinopril Cough        Medication List        Accurate as of Mar 26, 2024  3:03 PM. If you have any questions, ask your nurse or doctor.          levothyroxine  75 MCG tablet Commonly known as: SYNTHROID  Take 75 mcg by mouth daily before breakfast.   losartan  100 MG tablet Commonly known as: COZAAR  Take 100 mg by mouth daily.   metoprolol  succinate 50 MG 24 hr tablet Commonly known as: TOPROL -XL Take 50 mg by mouth daily.   rosuvastatin 10 MG tablet Commonly known as: CRESTOR Take 10 mg by mouth at bedtime.   simvastatin  20 MG tablet Commonly known as: ZOCOR  Take 20 mg by mouth at bedtime.        Allergies:  Allergies  Allergen Reactions   Lisinopril Cough    Family History: Family History  Problem Relation Age of Onset   Prostate cancer Brother 50 - 41       Currently in remission.   Diabetes Child    Bladder Cancer Neg Hx    Kidney cancer Neg Hx     Social History:  reports that he has been smoking cigarettes. He has never used smokeless tobacco.  He reports current alcohol use. He reports that he does not use drugs.   Physical Exam: BP (!) 144/88   Pulse 80   Ht 5\' 10"  (1.778 m)   Wt 231 lb (104.8 kg)   BMI 33.15 kg/m   Constitutional:  Alert and oriented, No acute distress. HEENT: [place] AT, moist mucus membranes.  Trachea midline, no masses. Neurologic: Grossly intact, no focal deficits, moving all 4 extremities. Psychiatric: Normal mood and affect.   Assessment & Plan:    1. Prostate cancer  - His PSA is appropriate and stable status post radiation hormones. Continue to check this at least every q6 month given his high risk of disease. The threshold for concern would be a PSA increase to 2.3.   - Discussed the potential impact of metabolic syndrome on testosterone levels and emphasized the importance  of diet and exercise in improving energy levels and overall health. No pharmacological intervention is planned at this time.  Return in about 1 year (around 03/26/2025).  I have reviewed the above documentation for accuracy and completeness, and I agree with the above.   Dustin Gimenez, MD   [facility] [address] [place], [place] [address] [phone]

## 2024-07-09 ENCOUNTER — Inpatient Hospital Stay: Payer: Medicare Other

## 2024-07-16 ENCOUNTER — Inpatient Hospital Stay: Attending: Oncology

## 2024-07-16 DIAGNOSIS — C61 Malignant neoplasm of prostate: Secondary | ICD-10-CM | POA: Diagnosis present

## 2024-07-16 DIAGNOSIS — D696 Thrombocytopenia, unspecified: Secondary | ICD-10-CM

## 2024-07-16 LAB — CBC WITH DIFFERENTIAL (CANCER CENTER ONLY)
Abs Immature Granulocytes: 0 K/uL (ref 0.00–0.07)
Basophils Absolute: 0 K/uL (ref 0.0–0.1)
Basophils Relative: 1 %
Eosinophils Absolute: 0.1 K/uL (ref 0.0–0.5)
Eosinophils Relative: 2 %
HCT: 43 % (ref 39.0–52.0)
Hemoglobin: 14.6 g/dL (ref 13.0–17.0)
Immature Granulocytes: 0 %
Lymphocytes Relative: 33 %
Lymphs Abs: 1.1 K/uL (ref 0.7–4.0)
MCH: 30.5 pg (ref 26.0–34.0)
MCHC: 34 g/dL (ref 30.0–36.0)
MCV: 90 fL (ref 80.0–100.0)
Monocytes Absolute: 0.4 K/uL (ref 0.1–1.0)
Monocytes Relative: 12 %
Neutro Abs: 1.8 K/uL (ref 1.7–7.7)
Neutrophils Relative %: 52 %
Platelet Count: 73 K/uL — ABNORMAL LOW (ref 150–400)
RBC: 4.78 MIL/uL (ref 4.22–5.81)
RDW: 12.1 % (ref 11.5–15.5)
WBC Count: 3.4 K/uL — ABNORMAL LOW (ref 4.0–10.5)
nRBC: 0 % (ref 0.0–0.2)

## 2024-08-22 ENCOUNTER — Encounter: Payer: Self-pay | Admitting: Family Medicine

## 2024-08-30 DIAGNOSIS — Z23 Encounter for immunization: Secondary | ICD-10-CM | POA: Diagnosis not present

## 2024-09-24 ENCOUNTER — Other Ambulatory Visit

## 2024-10-01 ENCOUNTER — Other Ambulatory Visit

## 2024-10-01 DIAGNOSIS — C61 Malignant neoplasm of prostate: Secondary | ICD-10-CM

## 2024-10-02 ENCOUNTER — Ambulatory Visit: Payer: Self-pay | Admitting: Urology

## 2024-10-02 LAB — PSA: Prostate Specific Ag, Serum: 0.4 ng/mL (ref 0.0–4.0)

## 2024-12-02 ENCOUNTER — Encounter: Payer: Self-pay | Admitting: *Deleted

## 2024-12-02 ENCOUNTER — Ambulatory Visit: Admission: EM | Admit: 2024-12-02 | Discharge: 2024-12-02 | Disposition: A | Discharge status: Home / Self Care

## 2024-12-02 DIAGNOSIS — I10 Essential (primary) hypertension: Secondary | ICD-10-CM | POA: Diagnosis not present

## 2024-12-02 NOTE — ED Triage Notes (Signed)
 Patient states his BP has been elevated 150-160s over 80s for the past week.  Takes 2 BP meds and not missing any doses.  Not symptomatic

## 2024-12-02 NOTE — ED Provider Notes (Signed)
 " [facility]    CSN: [id number] Arrival date & time: 12/02/24  1234      History   Chief Complaint Chief Complaint  Patient presents with   Hypertension    HPI Chad Cross is a 69 y.o. male.  Patient presents with elevated blood pressure readings at home over the last week.  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  He denies symptoms.  He denies chest pain, shortness of breath, headache, dizziness, vision change, numbness, weakness.  His medical history includes hypertension, hyperlipidemia, atherosclerosis, obesity.  He is on losartan  and metoprolol ; he states he takes his medications faithfully.  His most recent appointment with his PCP was 07/02/2024 at [facility] clinic; his blood pressure at that appointment was 118/88.  The history is provided by the patient and medical records.    Past Medical History:  Diagnosis Date   Anxiety 02/03/2016   Apnea, sleep 11/15/2007   Overview:  Has CPAP but is unable to tolerate the mask due to anxiety.    Blood dyscrasia    Cytopenia 02/03/2016   Elevated prostate specific antigen (PSA) 11/11/2015   HLD (hyperlipidemia) 02/03/2016   Hypertension    Prostate cancer (HCC)    Subclinical hypothyroidism 11/11/2015    Patient Active Problem List   Diagnosis Date Noted   Obesity (BMI 30-39.9) 12/09/2019   HTN (hypertension) 09/12/2019   Prostate cancer (HCC) 09/12/2019   Sepsis (HCC) 09/12/2019   UTI (urinary tract infection) 09/12/2019   Aortic atherosclerosis 06/27/2019   Anxiety 02/03/2016   Cytopenia 02/03/2016   HLD (hyperlipidemia) 02/03/2016   Elevated prostate specific antigen (PSA) 11/11/2015   Hypothyroidism 11/11/2015   OSA (obstructive sleep apnea) 11/15/2007    Past Surgical History:  Procedure Laterality Date   none         Home Medications    Prior to Admission medications  Medication Sig Start Date End Date Taking? Authorizing Provider  levothyroxine   (SYNTHROID ) 75 MCG tablet Take 75 mcg by mouth daily before breakfast.    [provider]  losartan  (COZAAR ) 100 MG tablet Take 100 mg by mouth daily. 07/09/20   [provider]  metoprolol  succinate (TOPROL -XL) 50 MG 24 hr tablet Take 50 mg by mouth daily. 07/28/20   [provider]  rosuvastatin (CRESTOR) 10 MG tablet Take 10 mg by mouth at bedtime. 07/29/20   [provider]  simvastatin  (ZOCOR ) 20 MG tablet Take 20 mg by mouth at bedtime. 06/08/20   [provider]    Family History Family History  Problem Relation Age of Onset   Prostate cancer Brother 38 - 21       Currently in remission.   Diabetes Child    Bladder Cancer Neg Hx    Kidney cancer Neg Hx     Social History Social History[1]   Allergies   Lisinopril   Review of Systems Review of Systems  Eyes:  Negative for visual disturbance.  Respiratory:  Negative for cough and shortness of breath.   Cardiovascular:  Negative for chest pain, palpitations and leg swelling.  Gastrointestinal:  Negative for nausea and vomiting.  Neurological:  Negative for dizziness, syncope, facial asymmetry, speech difficulty, weakness, light-headedness, numbness and headaches.     Physical Exam Triage Vital Signs ED Triage Vitals  Encounter Vitals Group     BP      Girls Systolic BP Percentile      Girls Diastolic BP Percentile  Boys Systolic BP Percentile      Boys Diastolic BP Percentile      Pulse      Resp      Temp      Temp src      SpO2      Weight      Height      Head Circumference      Peak Flow      Pain Score      Pain Loc      Pain Education      Exclude from Growth Chart    No data found.  Updated Vital Signs BP (!) 175/91   Pulse 89   Temp 97.9 F (36.6 C)   Resp 18   Wt 229 lb 3.2 oz (104 kg)   SpO2 96%   BMI 32.89 kg/m   Visual Acuity Right Eye Distance:   Left Eye Distance:   Bilateral Distance:    Right Eye Near:   Left Eye Near:     Bilateral Near:     Physical Exam Constitutional:      General: He is not in acute distress. HENT:     Mouth/Throat:     Mouth: Mucous membranes are moist.  Cardiovascular:     Rate and Rhythm: Normal rate and regular rhythm.     Heart sounds: Normal heart sounds.  Pulmonary:     Effort: Pulmonary effort is normal. No respiratory distress.     Breath sounds: Normal breath sounds.  Musculoskeletal:     Right lower leg: No edema.     Left lower leg: No edema.  Neurological:     General: No focal deficit present.     Mental Status: He is alert.     Sensory: No sensory deficit.     Motor: No weakness.     Gait: Gait normal.      UC Treatments / Results  Labs (all labs ordered are listed, but only abnormal results are displayed) Labs Reviewed - No data to display  EKG   Radiology No results found.  Procedures Procedures (including critical care time)  Medications Ordered in UC Medications - No data to display  Initial Impression / Assessment and Plan / UC Course  I have reviewed the triage vital signs and the nursing notes.  Pertinent labs & imaging results that were available during my care of the patient were reviewed by me and considered in my medical decision making (see chart for details).   Elevated blood pressure reading with hypertension.  Patient is asymptomatic.  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  He noted that his blood pressures were high.  He has been taking his blood pressure medications as prescribed.  His blood pressure is elevated here; initially 183/95 and then decreased to 179/91 and 175/91.  Discussed managing hypertension and education provided on this as well as monitoring blood pressure at home.  ED precautions given.  Instructed him to call his PCPs office this afternoon and schedule an appointment to be seen there tomorrow.  He agrees to plan of care.    Final Clinical Impressions(s) /  UC Diagnoses   Final diagnoses:  Elevated blood pressure reading in office with diagnosis of hypertension     Discharge Instructions      Your blood pressure is high today.  183/95 ; repeated 179/91;  repeated again 175/91.    Please call your primary care  provider's office this afternoon to schedule an appointment to be seen there tomorrow.    Go to the emergency department if your blood pressure reading is above 180/120 .       ED Prescriptions   None    PDMP not reviewed this encounter.    [1]  Social History Tobacco Use   Smoking status: Every Day    Current packs/day: 1.00    Types: Cigarettes   Smokeless tobacco: Never  Vaping Use   Vaping status: Never Used  Substance Use Topics   Alcohol use: Not Currently    Comment: social drinker   Drug use: No     Corlis Burnard DEL, NP 12/02/24 1336  "

## 2024-12-02 NOTE — Discharge Instructions (Addendum)
 Your blood pressure is high today.  183/95 ; repeated 179/91;  repeated again 175/91.    Please call your primary care provider's office this afternoon to schedule an appointment to be seen there tomorrow.    Go to the emergency department if your blood pressure reading is above 180/120 .

## 2025-01-07 ENCOUNTER — Ambulatory Visit: Payer: Medicare Other | Admitting: Oncology

## 2025-01-07 ENCOUNTER — Other Ambulatory Visit: Payer: Medicare Other

## 2025-03-18 ENCOUNTER — Other Ambulatory Visit

## 2025-03-25 ENCOUNTER — Ambulatory Visit: Admitting: Urology
